# Patient Record
Sex: Male | Born: 1975
Health system: Southern US, Community
[De-identification: ages and names within clinical notes are randomized; demographics above are authoritative.]

## PROBLEM LIST (undated history)

## (undated) DIAGNOSIS — E785 Hyperlipidemia, unspecified: Secondary | ICD-10-CM

## (undated) DIAGNOSIS — F419 Anxiety disorder, unspecified: Secondary | ICD-10-CM

## (undated) HISTORY — DX: Hyperlipidemia, unspecified: E78.5

## (undated) HISTORY — DX: Anxiety disorder, unspecified: F41.9

---

## 2008-01-10 HISTORY — PX: KNEE SURGERY: SHX244

## 2009-07-12 ENCOUNTER — Emergency Department (HOSPITAL_COMMUNITY): Admission: EM | Admit: 2009-07-12 | Discharge: 2009-07-13 | Payer: Self-pay | Admitting: Emergency Medicine

## 2013-04-23 ENCOUNTER — Ambulatory Visit: Payer: 59

## 2013-04-23 ENCOUNTER — Ambulatory Visit (INDEPENDENT_AMBULATORY_CARE_PROVIDER_SITE_OTHER): Payer: 59 | Admitting: Family Medicine

## 2013-04-23 VITALS — BP 104/64 | HR 48 | Temp 97.8°F | Resp 12 | Ht 67.0 in | Wt 166.0 lb

## 2013-04-23 DIAGNOSIS — R0789 Other chest pain: Secondary | ICD-10-CM

## 2013-04-23 DIAGNOSIS — Z0000 Encounter for general adult medical examination without abnormal findings: Secondary | ICD-10-CM

## 2013-04-23 DIAGNOSIS — I498 Other specified cardiac arrhythmias: Secondary | ICD-10-CM

## 2013-04-23 DIAGNOSIS — R001 Bradycardia, unspecified: Secondary | ICD-10-CM

## 2013-04-23 LAB — LIPID PANEL
Cholesterol: 258 mg/dL — ABNORMAL HIGH (ref 0–200)
HDL: 63 mg/dL (ref >39)
LDL Cholesterol: 172 mg/dL — ABNORMAL HIGH (ref 0–99)
Total CHOL/HDL Ratio: 4.1 ratio
Triglycerides: 117 mg/dL (ref <150)
VLDL: 23 mg/dL (ref 0–40)

## 2013-04-23 LAB — COMPREHENSIVE METABOLIC PANEL WITH GFR
AST: 20 U/L (ref 0–37)
Alkaline Phosphatase: 47 U/L (ref 39–117)
Calcium: 9.8 mg/dL (ref 8.4–10.5)
Chloride: 103 meq/L (ref 96–112)
Potassium: 4.4 meq/L (ref 3.5–5.3)
Sodium: 140 meq/L (ref 135–145)
Total Bilirubin: 0.9 mg/dL (ref 0.2–1.2)

## 2013-04-23 LAB — POCT UA - MICROSCOPIC ONLY
Bacteria, U Microscopic: NEGATIVE
Casts, Ur, LPF, POC: NEGATIVE
Crystals, Ur, HPF, POC: NEGATIVE
Epithelial cells, urine per micros: NEGATIVE
Mucus, UA: NEGATIVE
RBC, urine, microscopic: NEGATIVE
WBC, Ur, HPF, POC: NEGATIVE
Yeast, UA: NEGATIVE

## 2013-04-23 LAB — POCT URINALYSIS DIPSTICK
Bilirubin, UA: NEGATIVE
Blood, UA: NEGATIVE
Glucose, UA: NEGATIVE
Ketones, UA: NEGATIVE
Leukocytes, UA: NEGATIVE
Nitrite, UA: NEGATIVE
Protein, UA: NEGATIVE
Spec Grav, UA: 1.015
Urobilinogen, UA: 0.2
pH, UA: 7.5

## 2013-04-23 LAB — POCT CBC
Granulocyte percent: 60.6 %G (ref 37–80)
HCT, POC: 45.9 % (ref 43.5–53.7)
Hemoglobin: 14.8 g/dL (ref 14.1–18.1)
Lymph, poc: 1.6 (ref 0.6–3.4)
MCH, POC: 28.9 pg (ref 27–31.2)
MCHC: 32.2 g/dL (ref 31.8–35.4)
MCV: 89.7 fL (ref 80–97)
MID (cbc): 0.4 (ref 0–0.9)
MPV: 10.1 fL (ref 0–99.8)
POC Granulocyte: 3.1 (ref 2–6.9)
POC LYMPH PERCENT: 32.3 % (ref 10–50)
POC MID %: 7.1 %M (ref 0–12)
Platelet Count, POC: 223 10*3/uL (ref 142–424)
RBC: 5.12 M/uL (ref 4.69–6.13)
RDW, POC: 14 %
WBC: 5.1 10*3/uL (ref 4.6–10.2)

## 2013-04-23 LAB — COMPREHENSIVE METABOLIC PANEL
ALT: 25 U/L (ref 0–53)
Albumin: 4.8 g/dL (ref 3.5–5.2)
BUN: 16 mg/dL (ref 6–23)
CO2: 27 mEq/L (ref 19–32)
Creat: 0.9 mg/dL (ref 0.50–1.35)
Glucose, Bld: 92 mg/dL (ref 70–99)
Total Protein: 7.5 g/dL (ref 6.0–8.3)

## 2013-04-23 LAB — TSH: TSH: 2.431 u[IU]/mL (ref 0.350–4.500)

## 2013-04-23 NOTE — Patient Instructions (Signed)
Diet for Gastroesophageal Reflux Disease, Adult °Reflux (acid reflux) is when acid from your stomach flows up into the esophagus. When acid comes in contact with the esophagus, the acid causes irritation and soreness (inflammation) in the esophagus. When reflux happens often or so severely that it causes damage to the esophagus, it is called gastroesophageal reflux disease (GERD). Nutrition therapy can help ease the discomfort of GERD. °FOODS OR DRINKS TO AVOID OR LIMIT °· Smoking or chewing tobacco. Nicotine is one of the most potent stimulants to acid production in the gastrointestinal tract. °· Caffeinated and decaffeinated coffee and black tea. °· Regular or low-calorie carbonated beverages or energy drinks (caffeine-free carbonated beverages are allowed).   °· Strong spices, such as black pepper, white pepper, red pepper, cayenne, curry powder, and chili powder. °· Peppermint or spearmint. °· Chocolate. °· High-fat foods, including meats and fried foods. Extra added fats including oils, butter, salad dressings, and nuts. Limit these to less than 8 tsp per day. °· Fruits and vegetables if they are not tolerated, such as citrus fruits or tomatoes. °· Alcohol. °· Any food that seems to aggravate your condition. °If you have questions regarding your diet, call your caregiver or a registered dietitian. °OTHER THINGS THAT MAY HELP GERD INCLUDE:  °· Eating your meals slowly, in a relaxed setting. °· Eating 5 to 6 small meals per day instead of 3 large meals. °· Eliminating food for a period of time if it causes distress. °· Not lying down until 3 hours after eating a meal. °· Keeping the head of your bed raised 6 to 9 inches (15 to 23 cm) by using a foam wedge or blocks under the legs of the bed. Lying flat may make symptoms worse. °· Being physically active. Weight loss may be helpful in reducing reflux in overweight or obese adults. °· Wear loose fitting clothing °EXAMPLE MEAL PLAN °This meal plan is approximately  2,000 calories based on [employer]My[employer]gov meal planning guidelines. °Breakfast °· ½ cup cooked oatmeal. °· 1 cup strawberries. °· 1 cup low-fat milk. °· 1 oz almonds. °Snack °· 1 cup cucumber slices. °· 6 oz yogurt (made from low-fat or fat-free milk). °Lunch °· 2 slice whole-wheat bread. °· 2½ oz sliced turkey. °· 2 tsp mayonnaise. °· 1 cup blueberries. °· 1 cup snap peas. °Snack °· 6 whole-wheat crackers. °· 1 oz string cheese. °Dinner °· ½ cup brown rice. °· 1 cup mixed veggies. °· 1 tsp olive oil. °· 3 oz grilled fish. °Document Released: 12/26/2004 Document Revised: 03/20/2011 Document Reviewed: 11/11/2010 °ExitCare® Patient Information ©2014 [employer]. ° °

## 2013-04-23 NOTE — Progress Notes (Addendum)
Chief Complaint:  Chief Complaint  Patient presents with  . Annual Exam    HPI:was at exert , he exercises and runs and  Troy Ashley is a 38 y.o. male who is here for CPE Has not had a PE in a few years, 3-4 years. He has been having chest tightness, for the last 1 month, dull and deep pain diffuse, underneath sternum, has not been bad in the last 2 weeks but it was worse at nbight getting ready at night, a couple of occasion and made it worse, and he does n ot have CP with runnng or cardio. LAsts for a few hours. He denies any acid reflux, he had some in the past. He takes a teaspoon of yellow mustard.  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  No HTN, + hyperlipidemia, + very rare tobacco use when he plays golf No family heart history, + anxiety Has never had CP like this before.  Denies any n/v/abd pain, diaphoresis, leg swelling with CP; denies CHF    Past Medical History  Diagnosis Date  . Hyperlipidemia    History reviewed. No pertinent past surgical history. History   Social History  . Marital Status: Single    Spouse Name: N/A    Number of Children: N/A  . Years of Education: N/A   Social History Main Topics  . Smoking status: Light Tobacco Smoker    Types: Cigars  . Smokeless tobacco: None     Comment: occasional cigar  . Alcohol Use: None  . Drug Use: None  . Sexual Activity: Yes   Other Topics Concern  . None   Social History Narrative  . None   Family History  Problem Relation Age of Onset  . Anxiety disorder Mother    Allergies  Allergen Reactions  . Codeine Other (See Comments)    itchy   Prior to Admission medications   Not on File     ROS: The patient denies fevers, chills, night sweats, unintentional weight loss, chest pain, palpitations, wheezing, dyspnea on exertion, nausea, vomiting, abdominal pain, dysuria, hematuria, melena, numbness,  weakness, or tingling.   All other systems have been reviewed and were otherwise negative with the exception of those mentioned in the HPI and as above.    PHYSICAL EXAM: Filed Vitals:   04/23/13 0842  BP: 104/64  Pulse: 48  Temp: 97.8 F (36.6 C)  Resp: 12   Filed Vitals:   04/23/13 0842  Height: 5\' 7"  (1.702 m)  Weight: 166 lb (75.297 kg)   Body mass index is 25.99 kg/(m^2).  General: Alert, no acute distress HEENT:  Normocephalic, atraumatic, oropharynx patent. EOMI, PERRLA, fundo exam is normal, TM nl Cardiovascular:  Sinus brady, no rubs murmurs or gallops.  No Carotid bruits, radial pulse intact. No pedal edema.  Respiratory: Clear to auscultation bilaterally.  No wheezes, rales, or rhonchi.  No cyanosis, no use of accessory musculature GI: No organomegaly, abdomen is soft and non-tender, positive bowel sounds.  No masses. Skin: No rashes. Neurologic: Facial musculature symmetric. Psychiatric: Patient is appropriate throughout our interaction. Lymphatic: No cervical lymphadenopathy Musculoskeletal: Gait intact. 5/5 UE and Olan Kurek, 2/2 DTRs Circumcised, no rashes, neg inguinal hernia, no masses Normal GU exam   LABS: Results for orders placed in visit on 04/23/13  POCT CBC      Result Value Ref Range   WBC 5.1  4.6 - 10.2 K/uL   Lymph, poc 1.6  0.6 - 3.4   POC LYMPH PERCENT 32.3  10 - 50 %L   MID (cbc) 0.4  0 - 0.9   POC MID % 7.1  0 - 12 %M   POC Granulocyte 3.1  2 - 6.9   Granulocyte percent 60.6  37 - 80 %G   RBC 5.12  4.69 - 6.13 M/uL   Hemoglobin 14.8  14.1 - 18.1 g/dL   HCT, POC 45.9  43.5 - 53.7 %   MCV 89.7  80 - 97 fL   MCH, POC 28.9  27 - 31.2 pg   MCHC 32.2  31.8 - 35.4 g/dL   RDW, POC 14.0     Platelet Count, POC 223  142 - 424 K/uL   MPV 10.1  0 - 99.8 fL  POCT UA - MICROSCOPIC ONLY      Result Value Ref Range   WBC, Ur, HPF, POC neg     RBC, urine, microscopic neg     Bacteria, U Microscopic neg     Mucus, UA neg     Epithelial cells, urine  per micros neg     Crystals, Ur, HPF, POC neg     Casts, Ur, LPF, POC neg     Yeast, UA neg    POCT URINALYSIS DIPSTICK      Result Value Ref Range   Color, UA yellow     Clarity, UA clear     Glucose, UA neg     Bilirubin, UA neg     Ketones, UA neg     Spec Grav, UA 1.015     Blood, UA neg     pH, UA 7.5     Protein, UA neg     Urobilinogen, UA 0.2     Nitrite, UA neg     Leukocytes, UA Negative       EKG/XRAY:   Primary read interpreted by Dr. Marin Comment at [facility]. CXR no acute cardiopulmonary process,  inc vasc markings  ekg shows sinus brady, no ST changes    ASSESSMENT/PLAN: Encounter Diagnoses  Name Primary?  . Routine general medical examination at a health care facility Yes  . Chest pressure   . 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 Patient is aware that all medications have potential sideeffects and we are unable to predict every sideeffect or drug-drug interaction that may occur.  Glenford Bayley, DO 04/23/2013 10:13 AM

## 2013-05-01 ENCOUNTER — Encounter: Payer: Self-pay | Admitting: Family Medicine

## 2013-07-17 ENCOUNTER — Ambulatory Visit (INDEPENDENT_AMBULATORY_CARE_PROVIDER_SITE_OTHER): Payer: 59 | Admitting: Family Medicine

## 2013-07-17 VITALS — BP 120/72 | HR 83 | Temp 101.4°F | Resp 18 | Ht 68.0 in | Wt 165.0 lb

## 2013-07-17 DIAGNOSIS — R509 Fever, unspecified: Secondary | ICD-10-CM

## 2013-07-17 DIAGNOSIS — G4700 Insomnia, unspecified: Secondary | ICD-10-CM

## 2013-07-17 DIAGNOSIS — T148 Other injury of unspecified body region: Secondary | ICD-10-CM

## 2013-07-17 DIAGNOSIS — R21 Rash and other nonspecific skin eruption: Secondary | ICD-10-CM

## 2013-07-17 DIAGNOSIS — W57XXXA Bitten or stung by nonvenomous insect and other nonvenomous arthropods, initial encounter: Secondary | ICD-10-CM

## 2013-07-17 MED ORDER — IBUPROFEN 200 MG PO TABS
600.0000 mg | ORAL_TABLET | Freq: Once | ORAL | Status: AC
  Administered 2013-07-17: 600 mg via ORAL

## 2013-07-17 MED ORDER — DOXYCYCLINE HYCLATE 100 MG PO TABS: 100.0000 mg | ORAL_TABLET | Freq: Two times a day (BID) | ORAL | Status: DC

## 2013-07-17 NOTE — Progress Notes (Signed)
Chief Complaint:  Chief Complaint  Patient presents with  . Fever    Tuesday night  . Generalized Body Aches    X Tuesday night  . Headache    X Tuesday night  . Insect Bite    Noticed yesterday    HPI: Troy Ashley is a 38 y.o. male who is here for a 2 day history of fevers, chills, poor sleep and weak and noticed insect bite on back yesterday. T max was 100.7, taking ibuprofen 3 pill . Has not been feeling restless, body aches. Woke up at 4 am and felt miserable. The area in the back is tender. He noticed the rash  In the back  in the mirror. He does not know if it was a tick, he was out [place] the woods.   Past Medical History  Diagnosis Date  . Hyperlipidemia    History reviewed. No pertinent past surgical history. History   Social History  . Marital Status: Single    Spouse Name: N/A    Number of Children: N/A  . Years of Education: N/A   Social History Main Topics  . Smoking status: Light Tobacco Smoker    Types: Cigars  . Smokeless tobacco: None     Comment: occasional cigar  . Alcohol Use: None  . Drug Use: None  . Sexual Activity: Yes   Other Topics Concern  . None   Social History Narrative  . None   Family History  Problem Relation Age of Onset  . Anxiety disorder Mother    Allergies  Allergen Reactions  . Codeine Other (See Comments)    itchy   Prior to Admission medications   Not on File     ROS: The patient denies  night sweats, unintentional weight loss, chest pain, palpitations, wheezing, dyspnea on exertion, nausea, vomiting, abdominal pain, dysuria, hematuria, melena, numbness,  or tingling.   All other systems have been reviewed and were otherwise negative with the exception of those mentioned in the HPI and as above.    PHYSICAL EXAM: Filed Vitals:   07/17/13 1506  BP: 120/72  Pulse: 83  Temp: 101.4 F (38.6 C)  Resp: 18   Filed Vitals:   07/17/13 1506  Height: 5\' 8"  (1.727 m)  Weight: 165 lb (74.844 kg)    Body mass index is 25.09 kg/(m^2).  General: Alert, no acute distress HEENT:  Normocephalic, atraumatic, oropharynx patent. EOMI, PERRLA Cardiovascular:  Regular rate and rhythm, no rubs murmurs or gallops.  No Carotid bruits, radial pulse intact. No pedal edema.  Respiratory: Clear to auscultation bilaterally.  No wheezes, rales, or rhonchi.  No cyanosis, no use of accessory musculature GI: No organomegaly, abdomen is soft and non-tender, positive bowel sounds.  No masses. Skin: + right sided circular rash, tender. No fluctuance, no warmth Neurologic: Facial musculature symmetric. Psychiatric: Patient is appropriate throughout our interaction. Lymphatic: No cervical lymphadenopathy Musculoskeletal: Gait intact.   LABS: Results for orders placed in visit on 04/23/13  COMPREHENSIVE METABOLIC PANEL      Result Value Ref Range   Sodium 140  135 - 145 mEq/L   Potassium 4.4  3.5 - 5.3 mEq/L   Chloride 103  96 - 112 mEq/L   CO2 27  19 - 32 mEq/L   Glucose, Bld 92  70 - 99 mg/dL   BUN 16  6 - 23 mg/dL   Creat 0.90  0.50 - 1.35 mg/dL   Total Bilirubin 0.9  0.2 -  1.2 mg/dL   Alkaline Phosphatase 47  39 - 117 U/L   AST 20  0 - 37 U/L   ALT 25  0 - 53 U/L   Total Protein 7.5  6.0 - 8.3 g/dL   Albumin 4.8  3.5 - 5.2 g/dL   Calcium 9.8  8.4 - 10.5 mg/dL  LIPID PANEL      Result Value Ref Range   Cholesterol 258 (*) 0 - 200 mg/dL   Triglycerides 117  <150 mg/dL   HDL 63  >39 mg/dL   Total CHOL/HDL Ratio 4.1     VLDL 23  0 - 40 mg/dL   LDL Cholesterol 172 (*) 0 - 99 mg/dL  TSH      Result Value Ref Range   TSH 2.431  0.350 - 4.500 uIU/mL  POCT CBC      Result Value Ref Range   WBC 5.1  4.6 - 10.2 K/uL   Lymph, poc 1.6  0.6 - 3.4   POC LYMPH PERCENT 32.3  10 - 50 %L   MID (cbc) 0.4  0 - 0.9   POC MID % 7.1  0 - 12 %M   POC Granulocyte 3.1  2 - 6.9   Granulocyte percent 60.6  37 - 80 %G   RBC 5.12  4.69 - 6.13 M/uL   Hemoglobin 14.8  14.1 - 18.1 g/dL   HCT, POC 45.9  43.5 -  53.7 %   MCV 89.7  80 - 97 fL   MCH, POC 28.9  27 - 31.2 pg   MCHC 32.2  31.8 - 35.4 g/dL   RDW, POC 14.0     Platelet Count, POC 223  142 - 424 K/uL   MPV 10.1  0 - 99.8 fL  POCT UA - MICROSCOPIC ONLY      Result Value Ref Range   WBC, Ur, HPF, POC neg     RBC, urine, microscopic neg     Bacteria, U Microscopic neg     Mucus, UA neg     Epithelial cells, urine per micros neg     Crystals, Ur, HPF, POC neg     Casts, Ur, LPF, POC neg     Yeast, UA neg    POCT URINALYSIS DIPSTICK      Result Value Ref Range   Color, UA yellow     Clarity, UA clear     Glucose, UA neg     Bilirubin, UA neg     Ketones, UA neg     Spec Grav, UA 1.015     Blood, UA neg     pH, UA 7.5     Protein, UA neg     Urobilinogen, UA 0.2     Nitrite, UA neg     Leukocytes, UA Negative       EKG/XRAY:   Primary read interpreted by Dr. Marin Comment at [facility]   ASSESSMENT/PLAN: Encounter Diagnoses  Name Primary?  . Fever, unspecified Yes  . Rash and nonspecific skin eruption   . Insomnia   . Insect bite    Labs pending CBC, CMP Rx Doxycyline 100 mg BID x 10 days No need for  LYME or RMSF since too early F/u prn  Gross sideeffects, risk and benefits, and alternatives of medications d/w patient. Patient is aware that all medications have potential sideeffects and we are unable to predict every sideeffect or drug-drug interaction that may occur.  LE, [place], DO 07/17/2013 4:24 PM

## 2013-07-18 ENCOUNTER — Telehealth: Payer: Self-pay | Admitting: Family Medicine

## 2013-07-18 LAB — COMPLETE METABOLIC PANEL WITH GFR
Albumin: 4.5 g/dL (ref 3.5–5.2)
Alkaline Phosphatase: 48 U/L (ref 39–117)
Calcium: 9.4 mg/dL (ref 8.4–10.5)
Chloride: 102 mEq/L (ref 96–112)
Creat: 0.9 mg/dL (ref 0.50–1.35)
GFR, Est African American: >89 mL/min
GFR, Est Non African American: >89 mL/min
Potassium: 4 mEq/L (ref 3.5–5.3)
Sodium: 134 mEq/L — ABNORMAL LOW (ref 135–145)

## 2013-07-18 LAB — COMPLETE METABOLIC PANEL WITHOUT GFR
ALT: 23 U/L (ref 0–53)
AST: 21 U/L (ref 0–37)
BUN: 8 mg/dL (ref 6–23)
CO2: 23 meq/L (ref 19–32)
Glucose, Bld: 105 mg/dL — ABNORMAL HIGH (ref 70–99)
Total Bilirubin: 0.6 mg/dL (ref 0.2–1.2)
Total Protein: 7.1 g/dL (ref 6.0–8.3)

## 2013-07-18 LAB — CBC
HCT: 41 % (ref 39.0–52.0)
Hemoglobin: 14.4 g/dL (ref 13.0–17.0)
MCH: 29.1 pg (ref 26.0–34.0)
MCHC: 35.1 g/dL (ref 30.0–36.0)
MCV: 82.8 fL (ref 78.0–100.0)
Platelets: 187 10*3/uL (ref 150–400)
RBC: 4.95 MIL/uL (ref 4.22–5.81)
RDW: 13.8 % (ref 11.5–15.5)
WBC: 4.7 10*3/uL (ref 4.0–10.5)

## 2013-07-18 NOTE — Telephone Encounter (Signed)
Spoke to patient about labs.

## 2013-08-11 ENCOUNTER — Telehealth: Payer: Self-pay

## 2013-08-11 NOTE — Telephone Encounter (Signed)
Pt called in because he saw Dr. Marin Comment a couple of weeks ago, and was prescribed doxycycline (VIBRA-TABS) 100 MG tablet for a tick bite, he said that he is concerned about some of his continued symptoms, would like for Dr.Le or nurse to please give him a call.

## 2013-08-14 NOTE — Telephone Encounter (Signed)
lmom for pt to cb

## 2013-08-15 ENCOUNTER — Encounter: Payer: Self-pay | Admitting: Family Medicine

## 2013-08-15 NOTE — Telephone Encounter (Signed)
lmom for pt to cb

## 2013-08-15 NOTE — Telephone Encounter (Signed)
Pt needs to RTC for continued symptoms. LM to advise this on private VM.

## 2014-05-01 ENCOUNTER — Encounter: Payer: Self-pay | Admitting: Family Medicine

## 2015-04-21 ENCOUNTER — Ambulatory Visit (INDEPENDENT_AMBULATORY_CARE_PROVIDER_SITE_OTHER): Payer: Commercial Managed Care - HMO | Admitting: Family Medicine

## 2015-04-21 ENCOUNTER — Encounter: Payer: Self-pay | Admitting: Family Medicine

## 2015-04-21 VITALS — BP 104/80 | HR 64 | Temp 98.4°F | Ht 67.72 in | Wt 159.9 lb

## 2015-04-21 DIAGNOSIS — M79601 Pain in right arm: Secondary | ICD-10-CM | POA: Diagnosis not present

## 2015-04-21 DIAGNOSIS — E785 Hyperlipidemia, unspecified: Secondary | ICD-10-CM | POA: Insufficient documentation

## 2015-04-21 NOTE — Progress Notes (Signed)
HPI:  Troy Ashley is here to establish care with me. Last PCP and physical about 2 years ago.  He has history of HLD, on non pharmacologic treatment. He also states that he worries all the time for things he should not be worried about. He attributes it to mild anxiety. He can deal with stress and this does not cause problems at work or home. He denies depression or suicidal thoughts. Mother Hx of anxiety.  Has the following chronic problems that require follow up and concerns today:   Right shoulder "discomfort" for about 2 years after right shoulder injury with radicular pain RUE, resolved after chiropractor treatment. Since then he does not feel like RUE was completely back to normal, even though  Strength and ROM normalized.  He has had intermittent mild sore like pain on anterior aspect of shoulder and axillary area. No cervical pain, no numbness, no tingling, or burning. No weakness and no limitation of ROM or on his normal daily activities. He plays golf and notes symptoms a day after or so. He does not take medication for pain.   REVIEW OF SYSTEMS:  General: Negative for fever, fatigue, abnormal weight loss, changes in appetite.  Eyes: Negative for conjunctival erythema, visual changes.  ENT: Negative for earache, hearing loss, or ear drainage. Negative for rhinorrhea, nasal congestion, sinus pain, epistaxis. Negative for oral lesions, odynophagia, dysphagia.  Neck: negative for swollen glands or masses.  Cardiac: Negative for chest pain, exertional dyspnea, irregular HR, claudication, cold extremities, or edema.  Respiratory: Negative for cough, dyspnea, wheezing, or hemoptysis.  Abdomen: Negative for abdominal pain, changes in bowel habits, blood in stool or melena, nausea, or vomiting.  GU: Negative for dysuria, urinary frequency, urinary ungency, gross hematuria, urine incontinence.  MS: No joint erythema or edema, joint stiffness, or limitation of  ROM. + Right shoulder discomfort.  Neurologic: No confusion,  focal weakness, numbness or tingling, frequent/severe headaches, or tremor.  Psychiatric: No depression or sleep disorder. + Mild anxiety.  Skin: Negative for rash, ulcers, or skin color changes.   Past Medical History  Diagnosis Date  . Hyperlipidemia     Past Surgical History  Procedure Laterality Date  . Knee surgery Right 2010    Family History  Problem Relation Age of Onset  . Anxiety disorder Mother   . Hypertension Mother     Social History   Social History  . Marital Status: Single    Spouse Name: N/A  . Number of Children: N/A  . Years of Education: N/A   Social History Main Topics  . Smoking status: Light Tobacco Smoker    Types: Cigars  . Smokeless tobacco: None     Comment: occasional cigar  . Alcohol Use: None  . Drug Use: None  . Sexual Activity: Yes   Other Topics Concern  . None   Social History Narrative    No current outpatient prescriptions on file.   EXAM:  Filed Vitals:   04/21/15 1011  BP: 104/80  Pulse: 64  Temp: 98.4 F (36.9 C)    Body mass index is 24.52 kg/(m^2).  GENERAL: vitals reviewed and listed above. Well developed and nourished, appears well hydrated and in no acute distress.  ENT: atraumatic, conjunttiva clear, no obvious abnormalities on inspection of external nose and ears.  NECK: no obvious masses on inspection, normal ROM.  LUNGS: clear to auscultation bilaterally, no wheezes, rales or rhonchi, good air movement  CV: Regular rate and rhythm, no murmurs  appreciated, no peripheral edema. DP pulses 2+ bilateral.  ABDOMEN: Soft, no tender, no masses or hepatosplenomegaly appreciated.  MS: moves all extremities without noticeable abnormality, no muscle atrophy. Right shoulder: No tenderness with palpation or ROM, no masses. Cervical spine with normal ROM, no tenderness with palpation of paraspinal muscles and no pain elicited with  movement.  LYMPH: No axillary lymphadenopathy right. No cervical or supraclavicular lymphadenopathy.  NEURO: Alert and oriented x 3, no focal deficit appreciated, normal gait.  PSYCH: pleasant and cooperative, no obvious depression, well groomed, good eye contact. Mildly anxious.   ASSESSMENT AND PLAN:  Discussed the following assessment and plan:  Musculoskeletal pain of right upper extremity I think right shoulder discomfort is muscle related and could be residual symptom from prior injury. It seems to be stable for 2 years, so I do not think further work up is needed at this point. I recommended continuing Yoga and stretching exercises. F/U as needed.  Hyperlipidemia  Continue low fat diet. Further recommendations will be given according to results, future lab order placed sine he is not fasting today.   - Plan: Lipid Panel    -We reviewed the PMH, PSH, FH, SH, Meds and Allergies. -We provided refills for any medications we will prescribe as needed. -We addressed current concerns per orders and patient instructions. -We have asked for records for pertinent exams, studies, vaccines and notes from previous providers. He thinks his last Tdap was in 2013 after hand laceration.    -Patient advised to return or notify a doctor immediately if symptoms worsen or persist or new concerns arise.      Adysen Raphael G. Martinique, MD  [facility]. [place] office.

## 2015-04-21 NOTE — Progress Notes (Signed)
Pre visit review using our clinic review tool, if applicable. No additional management support is needed unless otherwise documented below in the visit note. 

## 2015-04-21 NOTE — Patient Instructions (Signed)
Thing to remember for today's visit:  Examination today normal. Right shoulder discomfort doe snot seem worrisome, be sure to warm and stretch before playing golf and continue Yoga. Continue healthy diet and regular exercise. I think you can come back in a year for his routine physical, we will schedule a cholesterol lab.

## 2015-04-30 ENCOUNTER — Other Ambulatory Visit (INDEPENDENT_AMBULATORY_CARE_PROVIDER_SITE_OTHER): Payer: Commercial Managed Care - HMO

## 2015-04-30 ENCOUNTER — Telehealth: Payer: Self-pay

## 2015-04-30 DIAGNOSIS — E785 Hyperlipidemia, unspecified: Secondary | ICD-10-CM

## 2015-04-30 LAB — LIPID PANEL
CHOL/HDL RATIO: 4
CHOLESTEROL: 248 mg/dL — AB (ref 0–200)
HDL: 60.6 mg/dL (ref >39.00)
LDL Cholesterol: 172 mg/dL — ABNORMAL HIGH (ref 0–99)
NonHDL: 186.93
TRIGLYCERIDES: 73 mg/dL (ref 0.0–149.0)
VLDL: 14.6 mg/dL (ref 0.0–40.0)

## 2015-04-30 NOTE — Telephone Encounter (Signed)
Pt would like to get blood test done in 3 month. Is that ok with you?

## 2015-04-30 NOTE — Telephone Encounter (Signed)
-----   Message from Betty G Martinique, MD sent at 04/30/2015  3:06 PM EDT ----- Cholesterol otherwise stable, mildly elevated. For now I do not think medication is needed. Low fat diet (animal fat mainly) and regular exercise recommended for now. It can be re-checked annually. Thanks.

## 2015-05-03 NOTE — Telephone Encounter (Signed)
If he wants he can repeat FLP in 4-6 months, although I still think he could wait a year. Thanks. BJ

## 2015-09-23 ENCOUNTER — Other Ambulatory Visit (INDEPENDENT_AMBULATORY_CARE_PROVIDER_SITE_OTHER): Payer: Commercial Managed Care - HMO

## 2015-09-23 DIAGNOSIS — E785 Hyperlipidemia, unspecified: Secondary | ICD-10-CM

## 2015-09-23 DIAGNOSIS — Z0000 Encounter for general adult medical examination without abnormal findings: Secondary | ICD-10-CM | POA: Diagnosis not present

## 2015-09-23 LAB — LIPID PANEL
CHOLESTEROL: 199 mg/dL (ref 0–200)
HDL: 52.3 mg/dL (ref >39.00)
LDL CALC: 119 mg/dL — AB (ref 0–99)
NonHDL: 146.61
TRIGLYCERIDES: 139 mg/dL (ref 0.0–149.0)
Total CHOL/HDL Ratio: 4
VLDL: 27.8 mg/dL (ref 0.0–40.0)

## 2015-09-23 LAB — BASIC METABOLIC PANEL
BUN: 15 mg/dL (ref 6–23)
CHLORIDE: 107 meq/L (ref 96–112)
CO2: 26 mEq/L (ref 19–32)
Calcium: 9.3 mg/dL (ref 8.4–10.5)
Creatinine, Ser: 0.92 mg/dL (ref 0.40–1.50)
GFR: 97.87 mL/min (ref >60.00)
GLUCOSE: 60 mg/dL — AB (ref 70–99)
POTASSIUM: 3.6 meq/L (ref 3.5–5.1)
SODIUM: 144 meq/L (ref 135–145)

## 2015-09-23 LAB — POC URINALSYSI DIPSTICK (AUTOMATED)
BILIRUBIN UA: NEGATIVE
GLUCOSE UA: NEGATIVE
Ketones, UA: NEGATIVE
Leukocytes, UA: NEGATIVE
Nitrite, UA: NEGATIVE
Protein, UA: NEGATIVE
RBC UA: NEGATIVE
SPEC GRAV UA: 1.02
Urobilinogen, UA: 0.2
pH, UA: 5.5

## 2015-09-23 LAB — HEPATIC FUNCTION PANEL
ALBUMIN: 4.4 g/dL (ref 3.5–5.2)
ALK PHOS: 44 U/L (ref 39–117)
ALT: 16 U/L (ref 0–53)
AST: 13 U/L (ref 0–37)
BILIRUBIN DIRECT: 0.1 mg/dL (ref 0.0–0.3)
TOTAL PROTEIN: 7 g/dL (ref 6.0–8.3)
Total Bilirubin: 0.4 mg/dL (ref 0.2–1.2)

## 2015-09-23 LAB — CBC WITH DIFFERENTIAL/PLATELET
BASOS ABS: 0 10*3/uL (ref 0.0–0.1)
Basophils Relative: 0.3 % (ref 0.0–3.0)
EOS ABS: 0.1 10*3/uL (ref 0.0–0.7)
Eosinophils Relative: 2.6 % (ref 0.0–5.0)
HEMATOCRIT: 41.5 % (ref 39.0–52.0)
HEMOGLOBIN: 14.1 g/dL (ref 13.0–17.0)
LYMPHS PCT: 29.2 % (ref 12.0–46.0)
Lymphs Abs: 1.4 10*3/uL (ref 0.7–4.0)
MCHC: 33.9 g/dL (ref 30.0–36.0)
MCV: 86.3 fl (ref 78.0–100.0)
MONOS PCT: 10.9 % (ref 3.0–12.0)
Monocytes Absolute: 0.5 10*3/uL (ref 0.1–1.0)
NEUTROS ABS: 2.7 10*3/uL (ref 1.4–7.7)
Neutrophils Relative %: 57 % (ref 43.0–77.0)
Platelets: 199 10*3/uL (ref 150.0–400.0)
RBC: 4.81 Mil/uL (ref 4.22–5.81)
RDW: 13.4 % (ref 11.5–15.5)
WBC: 4.7 10*3/uL (ref 4.0–10.5)

## 2015-09-23 LAB — TSH: TSH: 1.55 u[IU]/mL (ref 0.35–4.50)

## 2015-09-27 ENCOUNTER — Encounter: Payer: Commercial Managed Care - HMO | Admitting: Family Medicine

## 2015-09-28 ENCOUNTER — Encounter: Payer: Self-pay | Admitting: Family Medicine

## 2015-09-28 ENCOUNTER — Ambulatory Visit (INDEPENDENT_AMBULATORY_CARE_PROVIDER_SITE_OTHER): Payer: Commercial Managed Care - HMO | Admitting: Family Medicine

## 2015-09-28 VITALS — BP 124/80 | HR 79 | Temp 98.6°F | Resp 12 | Ht 67.72 in | Wt 160.4 lb

## 2015-09-28 DIAGNOSIS — Z0000 Encounter for general adult medical examination without abnormal findings: Secondary | ICD-10-CM | POA: Diagnosis not present

## 2015-09-28 DIAGNOSIS — E785 Hyperlipidemia, unspecified: Secondary | ICD-10-CM

## 2015-09-28 DIAGNOSIS — N50812 Left testicular pain: Secondary | ICD-10-CM | POA: Diagnosis not present

## 2015-09-28 NOTE — Progress Notes (Signed)
Pre visit review using our clinic review tool, if applicable. No additional management support is needed unless otherwise documented below in the visit note. 

## 2015-09-28 NOTE — Patient Instructions (Addendum)
A few things to remember from today's visit:   Routine general medical examination at a health care facility  Left testicular pain  It seems like a small epididymal cyst, monitor for now. Oral Ibuprofen 3 times daily for 5-7 days and local heat. Loose underwear and no bike.  At least 150 minutes of moderate exercise per week, daily brisk walking for 15-30 min is a good exercise option. Healthy diet low in saturated (animal) fats and sweets and consisting of fresh fruits and vegetables, lean meats such as fish and white chicken and whole grains.  - Vaccines:  Tdap vaccine every 10 years.  Shingles vaccine recommended at age 24, could be given after 38 years of age but not sure about insurance coverage.  Pneumonia vaccines:  Prevnar 67 at 22 and Pneumovax at 53.   -Screening recommendations for low/normal risk males:  Screening for diabetes at age 14-45 and every 3 years.    Lipid screening at 35 and every 3 years.   Colonoscopy for colon cancer screening at age 29 and until age 64.  Prostate cancer screening: some controversy.      Please be sure medication list is accurate. If a new problem present, please set up appointment sooner than planned today.

## 2015-09-28 NOTE — Progress Notes (Signed)
HPI:  [name] is a 40 y.o.male here today for his routine physical examination.  He lives with wife and daughter.  He follows a healthy diet and exercises regularly: Bike and yoga.  Chronic medical problems: HLD, on non pharmacologic treatment.  Hx of STD's: Denies   FHx negative for colon or prostate cancer.  -Denies high alcohol intake or Hx of illicit drug use. Smokes cigars occasionally.  -Concerns and/or follow up today: HLD and left testicular pain that started yesterday.  He had labs done recently.   Lab Results  Component Value Date   CHOL 199 09/21/2015   HDL 52.30 09/21/2015   LDLCALC 119 (H) 09/21/2015   TRIG 139.0 09/21/2015   CHOLHDL 4 09/21/2015   Lab Results  Component Value Date   ALT 16 09/23/2015   AST 13 09/23/2015   ALKPHOS 44 09/23/2015   BILITOT 0.4 09/23/2015   Lab Results  Component Value Date   TSH 1.55 09/23/2015   Lab Results  Component Value Date   WBC 4.7 09/23/2015   HGB 14.1 09/23/2015   HCT 41.5 09/23/2015   MCV 86.3 09/23/2015   PLT 199.0 09/23/2015    Lab Results  Component Value Date   CREATININE 0.92 09/23/2015   BUN 15 09/23/2015   NA 144 09/23/2015   K 3.6 09/23/2015   CL 107 09/23/2015   CO2 26 09/23/2015     Today he is complaining of left testicular pain, which she noted last night.  He denies any trauma, he has not noted any swelling or skin erythema. No urethral discharge or dysuria.  He describes pain as soreness, constant, moderate, no skin changes. Pain is exacerbated with palpation and alleviated by avoiding contact.    Review of Systems  Constitutional: Negative for activity change, appetite change, fatigue, fever and unexpected weight change.  HENT: Negative for dental problem, nosebleeds, sore throat, trouble swallowing and voice change.   Eyes: Negative for redness and visual disturbance.  Respiratory: Negative for apnea, cough, shortness of breath and wheezing.     Cardiovascular: Negative for chest pain, palpitations and leg swelling.  Gastrointestinal: Negative for abdominal pain, blood in stool, nausea and vomiting.       No changes in bowel habits.  Endocrine: Negative for cold intolerance, heat intolerance, polydipsia, polyphagia and polyuria.  Genitourinary: Positive for testicular pain (left). Negative for decreased urine volume, difficulty urinating, discharge, dysuria, genital sores and hematuria.  Musculoskeletal: Negative for arthralgias, back pain, joint swelling and myalgias.  Skin: Negative for color change and rash.  Neurological: Negative for dizziness, seizures, syncope, weakness, numbness and headaches.  Hematological: Negative for adenopathy. Does not bruise/bleed easily.  Psychiatric/Behavioral: Negative for confusion and sleep disturbance. The patient is not nervous/anxious.      No current outpatient prescriptions on file prior to visit.   No current facility-administered medications on file prior to visit.      Past Medical History:  Diagnosis Date  . Anxiety   . Hyperlipidemia     Allergies  Allergen Reactions  . Codeine Other (See Comments)    itchy    Family History  Problem Relation Age of Onset  . Anxiety disorder Mother   . Hypertension Mother     Social History   Social History  . Marital status: Married    Spouse name: N/A  . Number of children: N/A  . Years of education: N/A   Social History Main Topics  . Smoking status: Light  Tobacco Smoker    Types: Cigars  . Smokeless tobacco: Never Used     Comment: occasional cigar  . Alcohol use No  . Drug use: No  . Sexual activity: Yes   Other Topics Concern  . None   Social History Narrative  . None     Vitals:   09/28/15 1402  BP: 124/80  Pulse: 79  Resp: 12  Temp: 98.6 F (37 C)   Body mass index is 24.59 kg/m.  O2 sat at RA 98%  Wt Readings from Last 3 Encounters:  09/28/15 160 lb 6 oz (72.7 kg)  04/21/15 159 lb 14.4 oz  (72.5 kg)  07/17/13 165 lb (74.8 kg)      Physical Exam  Nursing note and vitals reviewed. Constitutional: He is oriented to person, place, and time. He appears well-developed and well-nourished. No distress.  HENT:  Head: Atraumatic.  Mouth/Throat: Oropharynx is clear and moist and mucous membranes are normal.  Eyes: Conjunctivae and EOM are normal. Pupils are equal, round, and reactive to light.  Neck: Normal range of motion. No thyroid mass and no thyromegaly present.  Cardiovascular: Normal rate and regular rhythm.   No murmur heard. Pulses:      Dorsalis pedis pulses are 2+ on the right side, and 2+ on the left side.  Respiratory: Effort normal and breath sounds normal. No respiratory distress.  GI: Soft. He exhibits no mass. There is no tenderness. Hernia confirmed negative in the right inguinal area and confirmed negative in the left inguinal area.  Genitourinary: Penis normal. Right testis shows no mass, no swelling and no tenderness. Left testis shows tenderness.  Genitourinary Comments: Tenderness upon palpation of lower pole left testicle along epididymus, ? 1 cm cyst. No testicular edema or scrotum erythema appreciated.  Musculoskeletal: He exhibits no edema or tenderness.  No major deformities appreciated and no signs of synovitis.  Lymphadenopathy:    He has no cervical adenopathy.       Right: No inguinal and no supraclavicular adenopathy present.       Left: No inguinal and no supraclavicular adenopathy present.  Neurological: He is alert and oriented to person, place, and time. He has normal strength. No cranial nerve deficit or sensory deficit. Coordination and gait normal.  Skin: Skin is warm. No erythema.  Psychiatric: His speech is normal. His mood appears anxious. Cognition and memory are normal.  Well groomed, good eye contact.        ASSESSMENT AND PLAN:   Discussed the following assessment and plan:   Tavaras was seen today for annual  exam.  Diagnoses and all orders for this visit:  Routine general medical examination at a health care facility  Current preventive preventive guidelines discussed. Discussed the importance of a healthy diet and regular exercise for prevention of some chronic medical conditions. Tdap 07/13/2011.  Next routine physical in 1-2 years.   Left testicular pain  We discussed possible causes, he seems a mild epididymitis, because of risk factors for STDs I recommended conservative treatment (loose underwear,no biking,local heat, and OTC NSAID's). I instructed him to let me know per my chart in about 7-10 days if pain isn't any better, before if worse. I may consider testicular ultrasound. Instructed about warning signs.  Hyperlipidemia  Continue nonpharmacologic treatment. Follow-up in 12 months.        Return in 1 year (on 09/27/2016) for routine+ fasting labs.    [place] [employer] G. Martinique, MD  [facility] [place] office.

## 2016-06-02 ENCOUNTER — Encounter: Payer: Self-pay | Admitting: Family Medicine

## 2016-06-02 ENCOUNTER — Ambulatory Visit (INDEPENDENT_AMBULATORY_CARE_PROVIDER_SITE_OTHER): Payer: Commercial Managed Care - HMO | Admitting: Family Medicine

## 2016-06-02 VITALS — BP 110/62 | HR 76 | Resp 12 | Ht 67.72 in | Wt 163.1 lb

## 2016-06-02 DIAGNOSIS — M545 Low back pain: Secondary | ICD-10-CM | POA: Diagnosis not present

## 2016-06-02 DIAGNOSIS — M5416 Radiculopathy, lumbar region: Secondary | ICD-10-CM | POA: Diagnosis not present

## 2016-06-02 DIAGNOSIS — R102 Pelvic and perineal pain: Secondary | ICD-10-CM | POA: Diagnosis not present

## 2016-06-02 MED ORDER — KETOROLAC TROMETHAMINE 60 MG/2ML IM SOLN
60.0000 mg | Freq: Once | INTRAMUSCULAR | Status: AC
  Administered 2016-06-02: 60 mg via INTRAMUSCULAR

## 2016-06-02 MED ORDER — PREDNISONE 20 MG PO TABS: ORAL_TABLET | ORAL | 0 refills | Status: AC

## 2016-06-02 MED ORDER — METHOCARBAMOL 500 MG PO TABS: 500.0000 mg | ORAL_TABLET | Freq: Three times a day (TID) | ORAL | 1 refills | Status: AC | PRN

## 2016-06-02 NOTE — Patient Instructions (Signed)
A few things to remember from today's visit:   Acute bilateral low back pain, with sciatica presence unspecified - Plan: predniSONE (DELTASONE) 20 MG tablet, methocarbamol (ROBAXIN) 500 MG tablet, MR LUMBAR SPINE WO CONTRAST  Perineal pain in male - Plan: predniSONE (DELTASONE) 20 MG tablet, MR LUMBAR SPINE WO CONTRAST  Lumbar radicular pain - Plan: predniSONE (DELTASONE) 20 MG tablet, MR LUMBAR SPINE WO CONTRAST    Back pain is very common in adults.The cause of back pain is rarely dangerous and the pain often gets better over time even with no pharmacologic treatment.  The cause of your back pain may not be known. Some common causes of back pain include: 1. Strain of the muscles or ligaments supporting the spine. 2. Wear and tear (degeneration) of the spinal disks. 3. Arthritis. 4. Direct injury to the back.  For many people, back pain may return. Since back pain is rarely dangerous, most people can learn to manage this condition on their own.  HOME CARE INSTRUCTIONS Watch your back pain for any changes. The following actions may help to lessen any discomfort you are feeling:  1. Remain active. It is stressful on your back to sit or stand in one place for long periods of time. Do not sit, drive, or stand in one place for more than 30 minutes at a time. Take short walks on even surfaces as soon as you are able.Try to increase the length of time you walk each day.  2. Exercise regularly as directed by your health care provider. Exercise helps your back heal faster. It also helps avoid future injury by keeping your muscles strong and flexible.  3. Do not stay in bed.Resting more than 1-2 days can delay your recovery.                                                      4. Pay attention to your body when you bend and lift. The most comfortable positions are those that put less stress on your recovering back.  5.  Always use proper lifting techniques, including: Bending your  knees. Keeping the load close to your body. Avoiding twisting.  6. Find a comfortable position to sleep. Use a firm mattress and lie on your side with your knees slightly bent. If you lie on your back, put a pillow under your knees.  7. Over the counter rubbing medications like Icy Hot or Asper cream with Lidocaine may help without significant side effects.  Acetaminophen and/or Aleve/Ibuprofen can be taken if needed and if not contraindications. Local ice and heat may be alternated to reduce pain and spasms. Also massage and even chiropractor treatment.      Muscle relaxants might or might not help, they cause drowsiness among other    side effects. They could also interact with some of medications you may be already taking (medications for depression/anxiety and some pain medications).   8. Maintain a healthy weight. Excess weight puts extra stress on your back and makes it difficult to maintain good posture.   SEEK MEDICAL CARE IF: worsening pain, associated fever, rash/edema on area, pain going to legs or buttocks, numbness/tingling, night pain, or abnormal weight loss.    SEEK IMMEDIATE MEDICAL CARE IF:  1. You develop new bowel or bladder control problems. 2. You have unusual weakness or numbness  in your arms or legs. 3. You develop nausea or vomiting. 4. You develop abdominal pain. 5. You feel faint.     Back Exercises The following exercises strengthen the muscles that help to support the back. They also help to keep the lower back flexible. Doing these exercises can help to prevent back pain or lessen existing pain. If you have back pain or discomfort, try doing these exercises 2-3 times each day or as told by your health care provider. When the pain goes away, do them once each day, but increase the number of times that you repeat the steps for each exercise (do more repetitions). If you do not have back pain or discomfort, do these exercises once each day or as told by your  health care provider.   EXERCISES Single Knee to Chest Repeat these steps 3-5 times for each leg: 5. Lie on your back on a firm bed or the floor with your legs extended. 6. Bring one knee to your chest. Your other leg should stay extended and in contact with the floor. 7. Hold your knee in place by grabbing your knee or thigh. 8. Pull on your knee until you feel a gentle stretch in your lower back. 9. Hold the stretch for 10-30 seconds. 10. Slowly release and straighten your leg.  Pelvic Tilt Repeat these steps 5-10 times: 2. Lie on your back on a firm bed or the floor with your legs extended. 3. Bend your knees so they are pointing toward the ceiling and your feet are flat on the floor. 4. Tighten your lower abdominal muscles to press your lower back against the floor. This motion will tilt your pelvis so your tailbone points up toward the ceiling instead of pointing to your feet or the floor. 5. With gentle tension and even breathing, hold this position for 5-10 seconds.  Cat-Cow Repeat these steps until your lower back becomes more flexible: 1. Get into a hands-and-knees position on a firm surface. Keep your hands under your shoulders, and keep your knees under your hips. You may place padding under your knees for comfort. 2. Let your head hang down, and point your tailbone toward the floor so your lower back becomes rounded like the back of a cat. 3. Hold this position for 5 seconds. 4. Slowly lift your head and point your tailbone up toward the ceiling so your back forms a sagging arch like the back of a cow. 5. Hold this position for 5 seconds.   Press-Ups Repeat these steps 5-10 times: 6. Lie on your abdomen (face-down) on the floor. 7. Place your palms near your head, about shoulder-width apart. 8. While you keep your back as relaxed as possible and keep your hips on the floor, slowly straighten your arms to raise the top half of your body and lift your shoulders. Do not use  your back muscles to raise your upper torso. You may adjust the placement of your hands to make yourself more comfortable. 9. Hold this position for 5 seconds while you keep your back relaxed. 10. Slowly return to lying flat on the floor.   Bridges Repeat these steps 10 times: 1. Lie on your back on a firm surface. 2. Bend your knees so they are pointing toward the ceiling and your feet are flat on the floor. 3. Tighten your buttocks muscles and lift your buttocks off of the floor until your waist is at almost the same height as your knees. You should feel the muscles  working in your buttocks and the back of your thighs. If you do not feel these muscles, slide your feet 1-2 inches farther away from your buttocks. 4. Hold this position for 3-5 seconds. 5. Slowly lower your hips to the starting position, and allow your buttocks muscles to relax completely. If this exercise is too easy, try doing it with your arms crossed over your chest.    Please be sure medication list is accurate. If a new problem present, please set up appointment sooner than planned today.

## 2016-06-02 NOTE — Progress Notes (Signed)
HPI:   ACUTE VISIT:  Chief Complaint  Patient presents with  . Back Pain    [name] is a 41 y.o. male, who is here today complaining of 4 weeks of lower back pain, which has ben radiated to inguinal area bilateral and testicles for the past week. He denies testicular masses,erythema,edema,or pain with palpation. He denies urinary symptoms.  On 05/09/16 he was playing softball, running to a base and felt  A little bit" of pain but mild, no limitations and did not have to leave game. "A few" days later he noted stiffness, bilateral lower back pain , not radiated initially.  He applied heating pad and did some stretching exercises but did not seem to help.  No associated LE numbness, tingling, urinary incontinence or retention, stool incontinence, or saddle anesthesia.   Back Pain  This is a recurrent problem. The current episode started 1 to 4 weeks ago. The problem occurs constantly. The problem has been gradually worsening since onset. The pain is present in the lumbar spine. The quality of the pain is described as aching. The pain is at a severity of 7/10. The pain is moderate. The pain is the same all the time. The symptoms are aggravated by bending and twisting. Stiffness is present all day. Associated symptoms include pelvic pain. Pertinent negatives include no abdominal pain, bladder incontinence, bowel incontinence, chest pain, dysuria, fever, headaches, leg pain, numbness, paresis, paresthesias, perianal numbness, tingling or weakness. He has tried NSAIDs and heat for the symptoms. The treatment provided no relief.   Alleviated by rest.  No rash or edema on area, fever, chills, or abnormal wt loss. Prior Hx of back pain: He has had back pain a couple times in the past but usually resolved in a couple days.  Pain is getting worse.  He has not taken any analgesic today.  Review of Systems  Constitutional: Negative for appetite change, chills, fatigue and  fever.  Respiratory: Negative for shortness of breath.   Cardiovascular: Negative for chest pain and leg swelling.  Gastrointestinal: Negative for abdominal pain, bowel incontinence, nausea and vomiting.  Genitourinary: Positive for pelvic pain and testicular pain. Negative for bladder incontinence, decreased urine volume, discharge, dysuria, flank pain, frequency, hematuria and scrotal swelling.  Musculoskeletal: Positive for back pain. Negative for gait problem and neck pain.  Skin: Negative for color change and rash.  Neurological: Negative for tingling, weakness, numbness, headaches and paresthesias.  Psychiatric/Behavioral: Negative for confusion. The patient is nervous/anxious.     No current outpatient prescriptions on file prior to visit.   No current facility-administered medications on file prior to visit.     Past Medical History:  Diagnosis Date  . Anxiety   . Hyperlipidemia    Allergies  Allergen Reactions  . Codeine Other (See Comments)    itchy    Social History   Social History  . Marital status: Married    Spouse name: N/A  . Number of children: N/A  . Years of education: N/A   Social History Main Topics  . Smoking status: Light Tobacco Smoker    Types: Cigars  . Smokeless tobacco: Never Used     Comment: occasional cigar  . Alcohol use No  . Drug use: No  . Sexual activity: Yes   Other Topics Concern  . None   Social History Narrative  . None    Vitals:   06/02/16 0751  BP: 110/62  Pulse: 76  Resp: 12  Body mass index is 25.01 kg/m.   Physical Exam  Nursing note and vitals reviewed. Constitutional: He is oriented to person, place, and time. He appears well-developed and well-nourished. No distress.  HENT:  Head: Atraumatic.  Mouth/Throat: Oropharynx is clear and moist and mucous membranes are normal.  Eyes: Conjunctivae and EOM are normal.  Cardiovascular: Normal rate and regular rhythm.   No murmur heard. Respiratory: Effort  normal and breath sounds normal. No respiratory distress.  GI: Soft. He exhibits no mass. There is no hepatomegaly. There is no tenderness.  Musculoskeletal: He exhibits no edema.  No significant deformity appreciated. No tenderness upon palpation of paraspinal muscles. Pain elicited with movement on exam table during examination. No local edema or erythema appreciated, no suspicious lesions.   Neurological: He is alert and oriented to person, place, and time. He has normal strength. Coordination normal.  Reflex Scores:      Patellar reflexes are 2+ on the right side and 2+ on the left side. SLR elicits back pain, R>L. He can walk on tip toes and heels. Antalgic gait.  Skin: Skin is warm. No erythema.  Psychiatric: His mood appears anxious.  Well groomed,good eye contact.     ASSESSMENT AND PLAN:   [name] was seen today for back pain.  Diagnoses and all orders for this visit:  Acute bilateral low back pain, with sciatica presence unspecified  After verbal consent he received Toradol 60 mg IM. He can continue OTC Ibuprofen in 6-8 hours to continue tid with food. I discussed some side effects of muscle relaxants. Relative rest.   -     predniSONE (DELTASONE) 20 MG tablet; 3 tabs for 3 days, 2 tabs for 3 days, 1 tabs for 3 days, and 1/2 tab for 3 days. Take tables together with breakfast. -     methocarbamol (ROBAXIN) 500 MG tablet; Take 1 tablet (500 mg total) by mouth every 8 (eight) hours as needed for muscle spasms. -     MR LUMBAR SPINE WO CONTRAST; Future  Perineal pain in male  He does same think genital examination is necessary at this time given the fact he recently had a CPE and he has not noted any deformity or abnormality on inspection. Instructed about warning signs.  -     predniSONE (DELTASONE) 20 MG tablet; 3 tabs for 3 days, 2 tabs for 3 days, 1 tabs for 3 days, and 1/2 tab for 3 days. Take tables together with breakfast. -     MR LUMBAR SPINE WO CONTRAST;  Future  Lumbar radicular pain  Because radiation to inguinal area and perineal area, I recommend Prednisone taper. Some side effects of medication discussed. Lumbar MRI will be arranged. Clearly instructed about warning signs. Further recommendations will be given according to imaging results.  -     predniSONE (DELTASONE) 20 MG tablet; 3 tabs for 3 days, 2 tabs for 3 days, 1 tabs for 3 days, and 1/2 tab for 3 days. Take tables together with breakfast. -     MR LUMBAR SPINE WO CONTRAST; Future      -[name] advised to seek immediate medical attention is symptoms suddenly get worse or to follow if new concerns arise.       Betty G. Martinique, MD  [facility]. [place] office.

## 2016-06-16 ENCOUNTER — Ambulatory Visit
Admission: RE | Admit: 2016-06-16 | Discharge: 2016-06-16 | Disposition: A | Discharge status: Home / Self Care | Payer: Commercial Managed Care - HMO | Source: Ambulatory Visit | Attending: Family Medicine | Admitting: Family Medicine

## 2016-06-16 DIAGNOSIS — S3992XA Unspecified injury of lower back, initial encounter: Secondary | ICD-10-CM | POA: Diagnosis not present

## 2016-06-16 DIAGNOSIS — M5416 Radiculopathy, lumbar region: Secondary | ICD-10-CM

## 2016-06-16 DIAGNOSIS — M545 Low back pain: Secondary | ICD-10-CM

## 2016-06-16 DIAGNOSIS — R102 Pelvic and perineal pain: Secondary | ICD-10-CM

## 2016-06-21 ENCOUNTER — Other Ambulatory Visit: Payer: Self-pay

## 2016-06-21 DIAGNOSIS — M5416 Radiculopathy, lumbar region: Secondary | ICD-10-CM

## 2016-09-26 NOTE — Progress Notes (Signed)
HPI:  Mr. Glyn Gerads is a 41 y.o.male here today for his routine physical examination.  Last CPE: 09/28/15.  He lives with his wife and daughter.  Regular exercise 3 or more times per week: Not consistently. Following a healthy diet: Yes.   Chronic medical problems: Back pain, HLD.   Lab Results  Component Value Date   CHOL 199 09/21/2015   HDL 52.30 09/21/2015   LDLCALC 119 (H) 09/21/2015   TRIG 139.0 09/21/2015   CHOLHDL 4 09/21/2015   He is still having lower back pain but mild. It is exacerbated by long outdoor activities that involveending and lifting. Alleviated by stretching. He did not see ortho. According to pt,  It was some confusion about appt time,since back pain improved he did not re-schedule appt.    Hx of STD's: Denies   -Denies high alcohol intake or Hx of illicit drug use. He drinks about 4 beers daily during weekends. Smokes cigar occasionally.   -Concerns and/or follow up today:  Left temporal scalp soreness for the past couple days. He think he bumped head against something while he was working outdoors. He denies associated visual changes, ear ache, numbness, tingling, rash, nausea, vomiting, or focal weakness. He has not taking any OTC medication. Pain is intermittently, exacerbated by palpation. He doesn't have pain unless he palpates area.   Review of Systems  Constitutional: Negative for activity change, appetite change, fatigue and fever.  HENT: Negative for dental problem, nosebleeds, sore throat, trouble swallowing and voice change.   Eyes: Negative for redness and visual disturbance.  Respiratory: Negative for apnea, cough, shortness of breath and wheezing.   Cardiovascular: Negative for chest pain, palpitations and leg swelling.  Gastrointestinal: Negative for abdominal pain, blood in stool, nausea and vomiting.  Endocrine: Negative for polydipsia and polyuria.  Genitourinary: Negative for decreased urine volume,  dysuria, genital sores, hematuria and testicular pain.  Musculoskeletal: Positive for back pain. Negative for arthralgias, joint swelling and myalgias.  Skin: Negative for color change and rash.  Neurological: Negative for dizziness, seizures, syncope, weakness, numbness and headaches.  Hematological: Negative for adenopathy. Does not bruise/bleed easily.  Psychiatric/Behavioral: Negative for confusion and sleep disturbance. The patient is not nervous/anxious.   All other systems reviewed and are negative.    No current outpatient prescriptions on file prior to visit.   No current facility-administered medications on file prior to visit.      Past Medical History:  Diagnosis Date  . Anxiety   . Hyperlipidemia     Past Surgical History:  Procedure Laterality Date  . KNEE SURGERY Right 2010    Allergies  Allergen Reactions  . Codeine Other (See Comments)    itchy    Family History  Problem Relation Age of Onset  . Anxiety disorder Mother   . Hypertension Mother     Social History   Social History  . Marital status: Married    Spouse name: N/A  . Number of children: N/A  . Years of education: N/A   Social History Main Topics  . Smoking status: Light Tobacco Smoker    Types: Cigars  . Smokeless tobacco: Never Used     Comment: occasional cigar  . Alcohol use No  . Drug use: No  . Sexual activity: Yes   Other Topics Concern  . None   Social History Narrative  . None     Vitals:   09/27/16 0810  BP: 124/80  Pulse: 60  Resp:  12  SpO2: 97%   Body mass index is 26.12 kg/m.   Wt Readings from Last 3 Encounters:  09/27/16 170 lb 6 oz (77.3 kg)  06/02/16 163 lb 2 oz (74 kg)  09/28/15 160 lb 6 oz (72.7 kg)     Physical Exam  Nursing note and vitals reviewed. Constitutional: He is oriented to person, place, and time. He appears well-developed and well-nourished. No distress.  HENT:  Head: Normocephalic and atraumatic.  Right Ear: Hearing,  tympanic membrane, external ear and ear canal normal.  Left Ear: Hearing, tympanic membrane, external ear and ear canal normal.  Mouth/Throat: Oropharynx is clear and moist and mucous membranes are normal.  No skull deformities or localized edema or erythema. Affected area left parieto occipital, with mild tenderness upon palpation, no crepitus or masses appreciated.  Eyes: Pupils are equal, round, and reactive to light. Conjunctivae and EOM are normal.  Neck: Normal range of motion. No tracheal deviation present. No thyromegaly present.  Cardiovascular: Normal rate and regular rhythm.   No murmur heard. Pulses:      Dorsalis pedis pulses are 2+ on the right side, and 2+ on the left side.  Respiratory: Effort normal and breath sounds normal. No respiratory distress.  GI: Soft. He exhibits no mass. There is no hepatomegaly. There is no tenderness.  Genitourinary:  Genitourinary Comments: Deferred, no concerns.  Musculoskeletal: He exhibits no edema or tenderness.  No major deformities appreciated and no signs of synovitis.  Lymphadenopathy:    He has no cervical adenopathy.       Right: No supraclavicular adenopathy present.       Left: No supraclavicular adenopathy present.  Neurological: He is alert and oriented to person, place, and time. He has normal strength. No cranial nerve deficit or sensory deficit. Coordination and gait normal.  Reflex Scores:      Bicep reflexes are 2+ on the right side and 2+ on the left side.      Patellar reflexes are 2+ on the right side and 2+ on the left side. Skin: Skin is warm. No rash noted. No erythema.  Psychiatric: His mood appears anxious.  Well groomed,good eye contact.    ASSESSMENT AND PLAN:   Ms.Kier was seen today for annual exam.  Diagnoses and all orders for this visit:  Lab Results  Component Value Date   CHOL 218 (H) 09/27/2016   HDL 51.10 09/27/2016   LDLCALC 132 (H) 09/27/2016   TRIG 178.0 (H) 09/27/2016   CHOLHDL 4  09/27/2016   Lab Results  Component Value Date   CREATININE 0.98 09/27/2016   BUN 13 09/27/2016   NA 138 09/27/2016   K 4.1 09/27/2016   CL 103 09/27/2016   CO2 30 09/27/2016    Routine general medical examination at a health care facility  We discussed the importance of regular physical activity and healthy diet for prevention of chronic illness and/or complications. Preventive guidelines reviewed. Vaccination up to date per pt report.  Next CPE in 1-2 years.  Hyperlipidemia, unspecified hyperlipidemia type  Continue non pharmacologic treatment for now. We will follow labs done today and will give further recommendations accordingly.  -     Lipid panel  Diabetes mellitus screening -     Basic metabolic panel  Encounter for screening for HIV -     HIV antibody  Scalp tenderness  ? Related to mild trauma. Improving. I do nit think imaging is needed at this time. Monitor for warning signs. F/U as  needed.    Return in 1 year (on 09/27/2017).    Cami Delawder G. Martin[place], MD  [facility]. [place] office.

## 2016-09-27 ENCOUNTER — Encounter: Payer: Self-pay | Admitting: Family Medicine

## 2016-09-27 ENCOUNTER — Ambulatory Visit (INDEPENDENT_AMBULATORY_CARE_PROVIDER_SITE_OTHER): Payer: 59 | Admitting: Family Medicine

## 2016-09-27 VITALS — BP 124/80 | HR 60 | Resp 12 | Ht 67.72 in | Wt 170.4 lb

## 2016-09-27 DIAGNOSIS — Z114 Encounter for screening for human immunodeficiency virus [HIV]: Secondary | ICD-10-CM

## 2016-09-27 DIAGNOSIS — Z131 Encounter for screening for diabetes mellitus: Secondary | ICD-10-CM | POA: Diagnosis not present

## 2016-09-27 DIAGNOSIS — Z0000 Encounter for general adult medical examination without abnormal findings: Secondary | ICD-10-CM | POA: Diagnosis not present

## 2016-09-27 DIAGNOSIS — R51 Headache: Secondary | ICD-10-CM | POA: Diagnosis not present

## 2016-09-27 DIAGNOSIS — E785 Hyperlipidemia, unspecified: Secondary | ICD-10-CM | POA: Diagnosis not present

## 2016-09-27 DIAGNOSIS — R519 Headache, unspecified: Secondary | ICD-10-CM

## 2016-09-27 LAB — BASIC METABOLIC PANEL
BUN: 13 mg/dL (ref 6–23)
CHLORIDE: 103 meq/L (ref 96–112)
CO2: 30 meq/L (ref 19–32)
Calcium: 9.3 mg/dL (ref 8.4–10.5)
Creatinine, Ser: 0.98 mg/dL (ref 0.40–1.50)
GFR: 90.51 mL/min (ref >60.00)
Glucose, Bld: 92 mg/dL (ref 70–99)
POTASSIUM: 4.1 meq/L (ref 3.5–5.1)
Sodium: 138 mEq/L (ref 135–145)

## 2016-09-27 LAB — LIPID PANEL
CHOL/HDL RATIO: 4
CHOLESTEROL: 218 mg/dL — AB (ref 0–200)
HDL: 51.1 mg/dL (ref >39.00)
LDL CALC: 132 mg/dL — AB (ref 0–99)
NonHDL: 167.12
TRIGLYCERIDES: 178 mg/dL — AB (ref 0.0–149.0)
VLDL: 35.6 mg/dL (ref 0.0–40.0)

## 2016-09-27 NOTE — Patient Instructions (Signed)
A few things to remember from today's visit:   Routine general medical examination at a health care facility  Hyperlipidemia, unspecified hyperlipidemia type - Plan: Lipid panel  Diabetes mellitus screening - Plan: Basic metabolic panel  Encounter for screening for HIV - Plan: HIV antibody    At least 150 minutes of moderate exercise per week, daily brisk walking for 15-30 min is a good exercise option. Healthy diet low in saturated (animal) fats and sweets and consisting of fresh fruits and vegetables, lean meats such as fish and white chicken and whole grains.  - Vaccines:  Tdap vaccine every 10 years.  Shingles vaccine recommended at age 45, could be given after 39 years of age but not sure about insurance coverage.  Pneumonia vaccines:  Prevnar 44 at 13 and Pneumovax at 2.   -Screening recommendations for low/normal risk males:  Screening for diabetes at age 40-45 and every 3 years.    Lipid screening at 35 and every 3 years.   Colonoscopy for colon cancer screening at age 69 and until age 87.  Prostate cancer screening: some controversy.         Please be sure medication list is accurate. If a new problem present, please set up appointment sooner than planned today.

## 2016-09-28 LAB — HIV ANTIBODY (ROUTINE TESTING W REFLEX): HIV 1&2 Ab, 4th Generation: NONREACTIVE

## 2017-01-17 ENCOUNTER — Ambulatory Visit: Payer: 59 | Admitting: Family Medicine

## 2017-01-17 ENCOUNTER — Encounter: Payer: Self-pay | Admitting: Family Medicine

## 2017-01-17 VITALS — BP 120/77 | HR 72 | Temp 98.8°F | Resp 12 | Ht 67.72 in | Wt 174.0 lb

## 2017-01-17 DIAGNOSIS — R0781 Pleurodynia: Secondary | ICD-10-CM | POA: Diagnosis not present

## 2017-01-17 DIAGNOSIS — M954 Acquired deformity of chest and rib: Secondary | ICD-10-CM | POA: Diagnosis not present

## 2017-01-17 DIAGNOSIS — IMO0002 Reserved for concepts with insufficient information to code with codable children: Secondary | ICD-10-CM

## 2017-01-17 NOTE — Patient Instructions (Addendum)
A few things to remember from today's visit:   Costal margin pain - Plan: DG Ribs Unilateral W/Chest Right  Deformity of thoracic structure - Plan: [place] CHEST SOFT TISSUE  Today X ray was ordered.  This can be done at [facility] at [facility] between 8 am and 5 pm: [place]. [phone].   Please be sure medication list is accurate. If a new problem present, please set up appointment sooner than planned today.

## 2017-01-17 NOTE — Progress Notes (Signed)
ACUTE VISIT   HPI:  Chief Complaint  Patient presents with  . Right-sided rib cage discomfort    no pain just discomfort, feels like it is swelling, have been going on for about a year, but getting worse for the last 3 to 6 months    [place] is a 42 y.o. male, who is here today complaining of about 6 months or more of mild prominence noted on the right axillary area.  "It looks a little different."   He also has some "discomfort" for the past 3 months, mild achy sensation, 2/10, no radiated around ribs in same area. He cannot pinpoint area of discomfort.  He has not identified exacerbating or alleviating factors. She is not sure if problem is getting worse.  He has no noted fever, chills, abnormal weight loss, cough, dyspnea, wheezing, or arthralgias. He denies any history of trauma or skin changes. He has not tried OTC medications for problems for this problem.   Review of Systems  Constitutional: Negative for activity change, appetite change, fatigue, fever and unexpected weight change.  HENT: Negative for nosebleeds, sore throat and trouble swallowing.   Eyes: Negative for redness and visual disturbance.  Respiratory: Negative for cough, shortness of breath and wheezing.   Cardiovascular: Negative for chest pain, palpitations and leg swelling.  Gastrointestinal: Negative for abdominal pain, nausea and vomiting.  Genitourinary: Negative for decreased urine volume, dysuria and hematuria.  Musculoskeletal: Negative for back pain and gait problem.  Skin: Negative for rash.  Neurological: Negative for weakness, numbness and headaches.  Psychiatric/Behavioral: Negative for confusion. The patient is nervous/anxious.       No current outpatient medications on file prior to visit.   No current facility-administered medications on file prior to visit.      Past Medical History:  Diagnosis Date  . Anxiety   . Hyperlipidemia    Allergies  Allergen  Reactions  . Codeine Other (See Comments)    itchy    Social History   Socioeconomic History  . Marital status: Married    Spouse name: None  . Number of children: None  . Years of education: None  . Highest education level: None  Social Needs  . Financial resource strain: None  . Food insecurity - worry: None  . Food insecurity - inability: None  . Transportation needs - medical: None  . Transportation needs - non-medical: None  Occupational History  . None  Tobacco Use  . Smoking status: Light Tobacco Smoker    Types: Cigars  . Smokeless tobacco: Never Used  . Tobacco comment: occasional cigar  Substance and Sexual Activity  . Alcohol use: No  . Drug use: No  . Sexual activity: Yes  Other Topics Concern  . None  Social History Narrative  . None    Vitals:   01/17/17 0830  BP: 120/77  Pulse: 72  Resp: 12  Temp: 98.8 F (37.1 C)  SpO2: 98%   Body mass index is 26.68 kg/m.    Physical Exam  Nursing note and vitals reviewed. Constitutional: He is oriented to person, place, and time. He appears well-developed and well-nourished. No distress.  HENT:  Head: Normocephalic and atraumatic.  Eyes: Conjunctivae are normal.  Cardiovascular: Normal rate and regular rhythm.  Respiratory: Effort normal and breath sounds normal. No respiratory distress. He exhibits no tenderness.  Musculoskeletal: He exhibits no edema.       Thoracic back: He exhibits no tenderness and no bony  tenderness.  Shoulder: No deformity, edema, or erythema appreciated.No muscle atrophy. Mild tenderness upon palpation of right posterior ribs, 7th-8th. No skin changes or masses appreciated,no deformities. No tenderness upon palpation of paraspinal muscles. Mild prominence noted under right axillary area,between ant and mid axillary lines.No tender, borders not well defined, and seems mobile.   Lymphadenopathy:    He has no cervical adenopathy.       Right axillary: No pectoral and no lateral  adenopathy present.       Right: No supraclavicular adenopathy present.  Neurological: He is alert and oriented to person, place, and time. He has normal strength. Gait normal.  Skin: No rash noted. No erythema.  Psychiatric: His mood appears anxious.  Well-groomed, good eye contact.    ASSESSMENT AND PLAN:    Mr. Treshon was seen today for right-sided rib cage discomfort.  Diagnoses and all orders for this visit:  Costal margin pain  Possible etiologies discussed, costochondritis. Examination today does not suggest a serious process. Instructed to monitor for new associated symptoms.  Further recommendations will be given according to imaging results.  -     DG Ribs Unilateral W/Chest Right; Future  Deformity of thoracic structure  Reassured, most likely benign, ? Lipoma. He is very concerned I would like some type of imaging done, so soft tissue US will be arranged. Instructed about warning signs.  -     [place] CHEST SOFT TISSUE; Future       -Mr.Woodard Perrell Probasco was advised to seek immediate medical attention if sudden worsening symptoms or to follow if they persist or if new concerns arise.       Marenda Accardi G. Martin[place], MD  [facility]. [place] office.

## 2017-01-22 ENCOUNTER — Ambulatory Visit (INDEPENDENT_AMBULATORY_CARE_PROVIDER_SITE_OTHER)
Admission: RE | Admit: 2017-01-22 | Discharge: 2017-01-22 | Disposition: A | Discharge status: Home / Self Care | Payer: 59 | Source: Ambulatory Visit | Attending: Family Medicine | Admitting: Family Medicine

## 2017-01-22 DIAGNOSIS — R0781 Pleurodynia: Secondary | ICD-10-CM | POA: Diagnosis not present

## 2017-01-30 ENCOUNTER — Other Ambulatory Visit: Payer: 59

## 2017-11-27 DIAGNOSIS — M79645 Pain in left finger(s): Secondary | ICD-10-CM | POA: Diagnosis not present

## 2017-11-28 DIAGNOSIS — M9902 Segmental and somatic dysfunction of thoracic region: Secondary | ICD-10-CM | POA: Diagnosis not present

## 2017-11-28 DIAGNOSIS — M9903 Segmental and somatic dysfunction of lumbar region: Secondary | ICD-10-CM | POA: Diagnosis not present

## 2017-11-28 DIAGNOSIS — M9905 Segmental and somatic dysfunction of pelvic region: Secondary | ICD-10-CM | POA: Diagnosis not present

## 2017-12-12 DIAGNOSIS — D485 Neoplasm of uncertain behavior of skin: Secondary | ICD-10-CM | POA: Diagnosis not present

## 2017-12-12 DIAGNOSIS — D225 Melanocytic nevi of trunk: Secondary | ICD-10-CM | POA: Diagnosis not present

## 2018-05-13 IMAGING — DX DG RIBS W/ CHEST 3+V*R*
4 series · 4 of 4 positions shown · non-contrast
Comparison: Chest x-ray of April 23, 2013

CLINICAL DATA: Persistent mid right lateral and posterior ribcage
pain for the past 3 months. No chest complaint. There is tenderness
to palpation and bony prominence of the chest wall in the
symptomatic region.

EXAM:
RIGHT RIBS AND CHEST - 3+ VIEW

[chest pa]
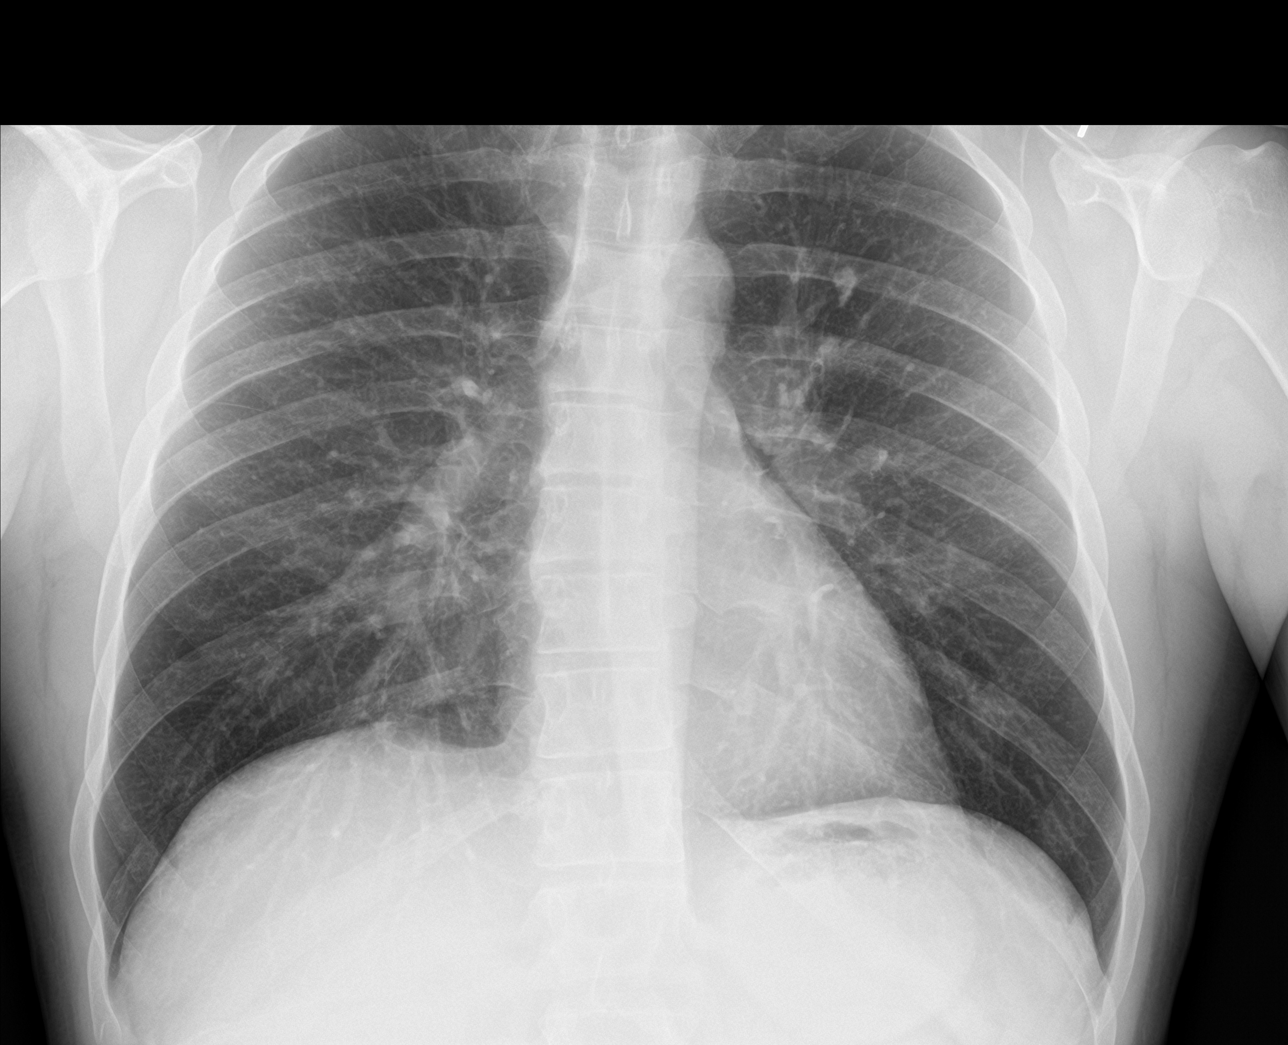

[rib pa (1 of 2)]
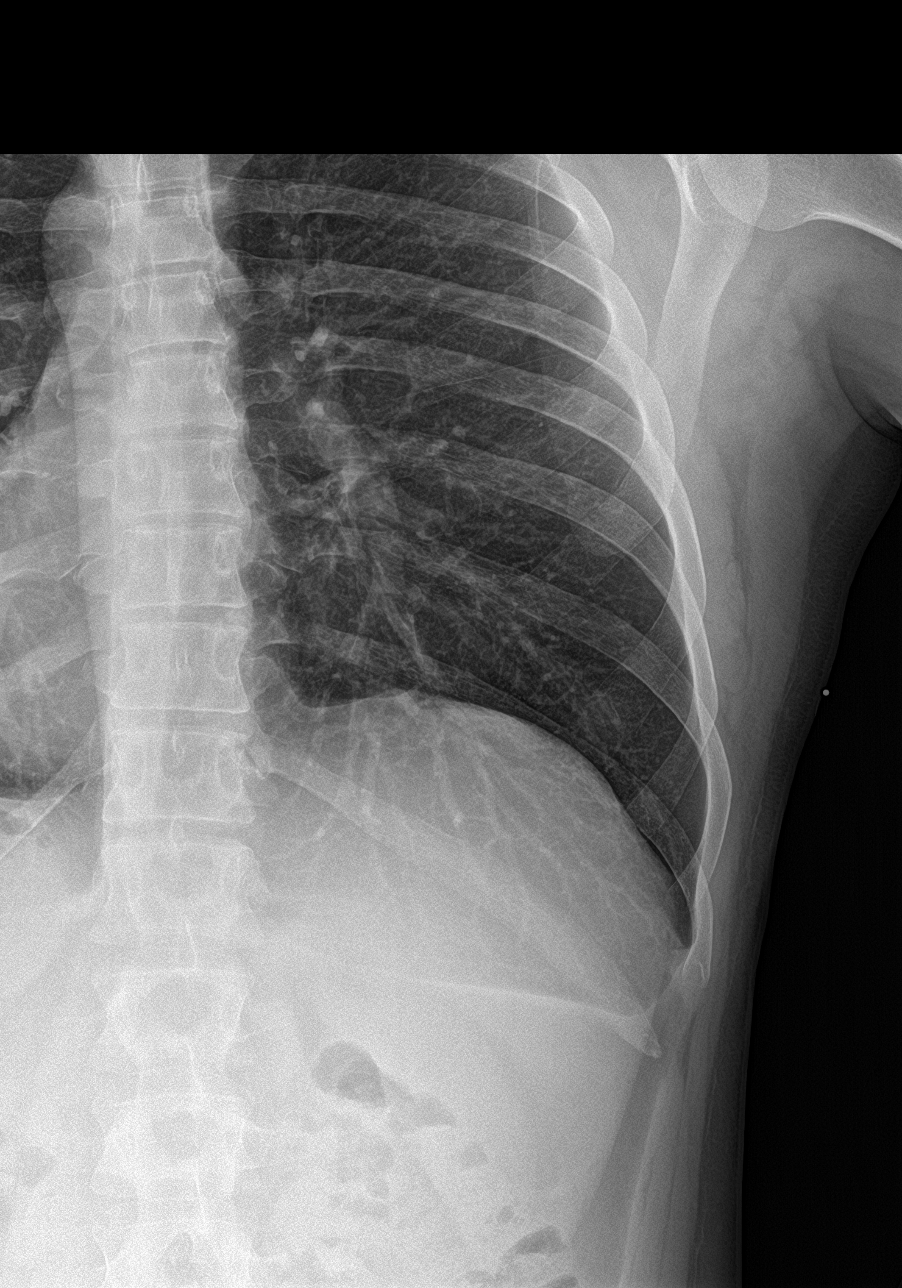

[rib pa (2 of 2)]
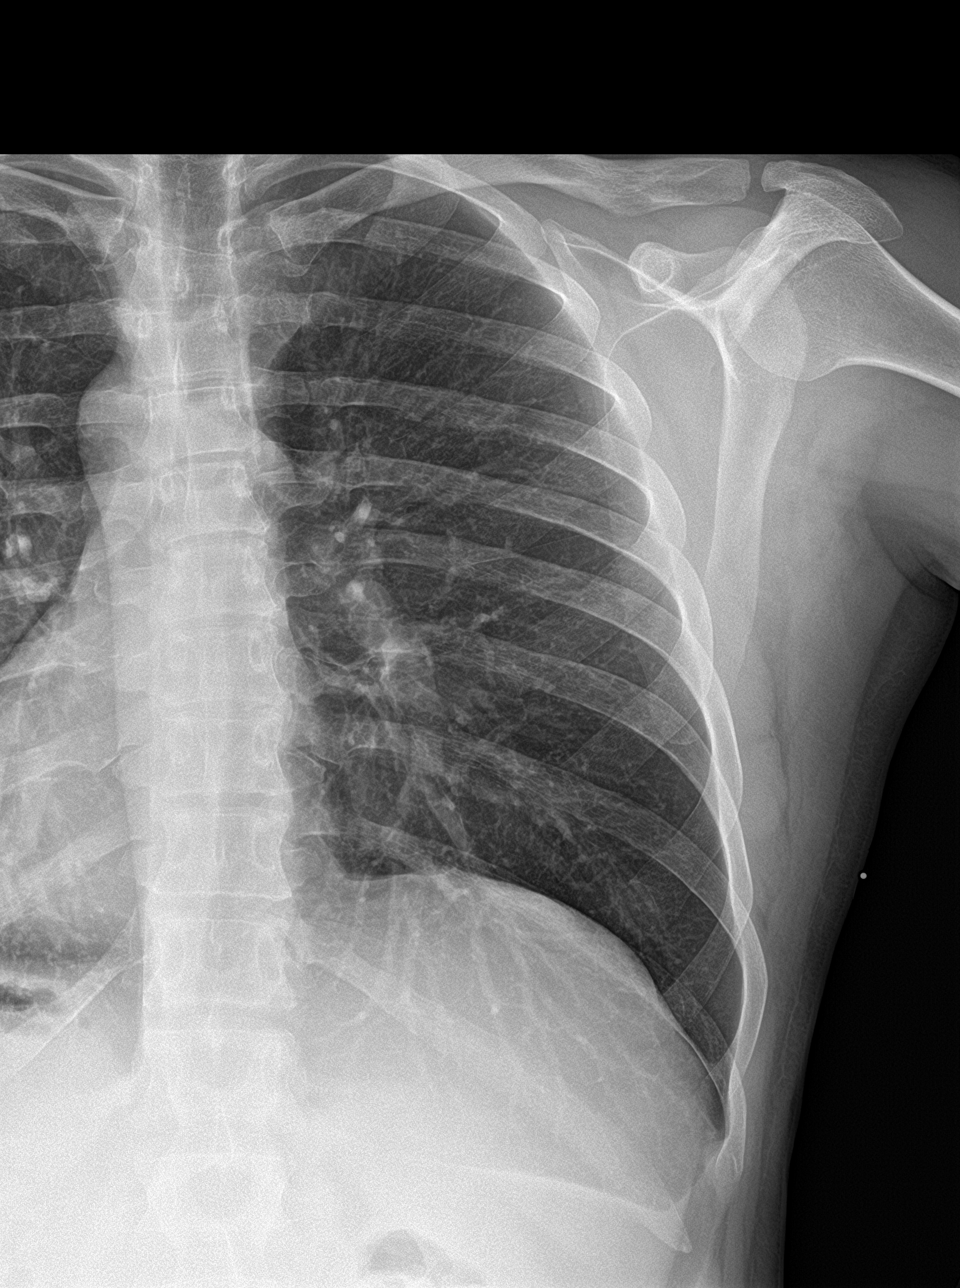

[rib obl]
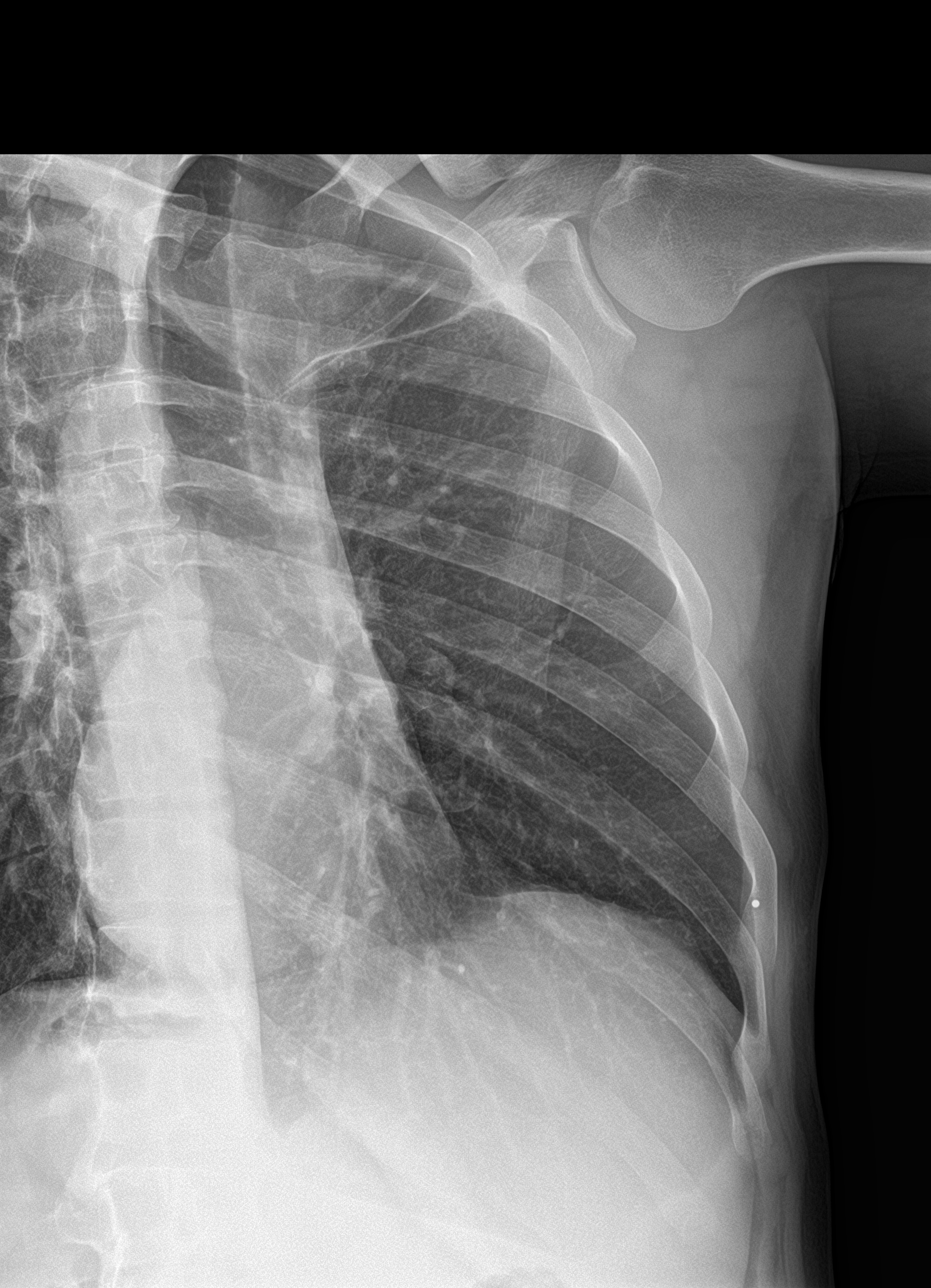

[4 of 4 positions shown; findings below may reference images not displayed]

FINDINGS: The lungs are adequately inflated. There is evidence of previous
granulomatous infection which is stable. There is no infiltrate,
atelectasis, or pleural effusion. The heart and pulmonary
vascularity are normal.

Right rib detail images reveal no acute or old rib fracture. There
is no lytic or blastic rib lesion.
IMPRESSION: There is no acute or significant chronic bony abnormality of the
right ribs. There is no acute cardiopulmonary abnormality.

## 2019-08-22 ENCOUNTER — Encounter: Payer: 59 | Admitting: Family Medicine

## 2019-09-02 ENCOUNTER — Encounter: Payer: Self-pay | Admitting: Family Medicine

## 2019-09-02 ENCOUNTER — Ambulatory Visit (INDEPENDENT_AMBULATORY_CARE_PROVIDER_SITE_OTHER): Payer: 59 | Admitting: Family Medicine

## 2019-09-02 ENCOUNTER — Other Ambulatory Visit: Payer: Self-pay

## 2019-09-02 VITALS — BP 126/80 | HR 62 | Temp 98.1°F | Resp 12 | Ht 67.72 in | Wt 175.5 lb

## 2019-09-02 DIAGNOSIS — E785 Hyperlipidemia, unspecified: Secondary | ICD-10-CM

## 2019-09-02 DIAGNOSIS — M545 Low back pain, unspecified: Secondary | ICD-10-CM

## 2019-09-02 DIAGNOSIS — Z13228 Encounter for screening for other metabolic disorders: Secondary | ICD-10-CM

## 2019-09-02 DIAGNOSIS — Z1329 Encounter for screening for other suspected endocrine disorder: Secondary | ICD-10-CM

## 2019-09-02 DIAGNOSIS — Z0000 Encounter for general adult medical examination without abnormal findings: Secondary | ICD-10-CM | POA: Diagnosis not present

## 2019-09-02 DIAGNOSIS — Z1159 Encounter for screening for other viral diseases: Secondary | ICD-10-CM | POA: Diagnosis not present

## 2019-09-02 DIAGNOSIS — Z130 Encounter for screening for diseases of the blood and blood-forming organs and certain disorders involving the immune mechanism: Secondary | ICD-10-CM

## 2019-09-02 DIAGNOSIS — S6992XA Unspecified injury of left wrist, hand and finger(s), initial encounter: Secondary | ICD-10-CM

## 2019-09-02 DIAGNOSIS — G8929 Other chronic pain: Secondary | ICD-10-CM

## 2019-09-02 NOTE — Progress Notes (Signed)
HPI:  [name] is a 44 y.o.male here today for his routine physical examination.  Last CPE: 09/27/16. He lives with his wife and daughter.  Regular exercise 3 or more times per week: Runs 2 times per week and walks "a lot"through the week. Following a healthy diet: Yes. He does not drink sodas.  Chronic medical problems: HLD and chronic back pain among some.  Negative for DM II,CAD,and HTN.  There is no immunization history on file for this patient.  -Hep C screening: Never.  Last colon cancer screening: N/A Last prostate ca screening: N/A  Negative for high alcohol intake or Hx of illicit drug use. Smoke cigars sometimes. 12 beers during weekends.  -Concerns and/or follow up today:   Back pain: Intermittent episodes of back pain. Completed PT, which helped.  He does stretching exercises at home, which help. Local heat. Exacerbated by some activities: Tennis,golf,and running exacerbate problem. Pain is not debilitating, it can be mild to moderate. Pain does not limit daily activities.  Lumbar MRI in 06/2016: 1. At L4-5 there is a mild broad-based disc bulge with a small central disc protrusion with mild impression on the ventral thecal sac. Mild bilateral facet arthropathy.  Lab Results  Component Value Date   CHOL 218 (H) 09/27/2016   HDL 51.10 09/27/2016   LDLCALC 132 (H) 09/27/2016   TRIG 178.0 (H) 09/27/2016   CHOLHDL 4 09/27/2016   Dislocated left 5th finger about 2 weeks ago when playing with daughter. Hhe did pull finger and phalanx back in place. He did not seek medical attention. No limitation of ROM, seems swollen but there is no pain.  Review of Systems  Constitutional: Negative for activity change, appetite change, fatigue and fever.  HENT: Negative for dental problem, mouth sores, nosebleeds and sore throat.   Eyes: Negative for redness and visual disturbance.  Respiratory: Negative for cough, shortness of breath and wheezing.     Cardiovascular: Negative for chest pain, palpitations and leg swelling.  Gastrointestinal: Negative for abdominal pain, blood in stool, nausea and vomiting.  Endocrine: Negative for cold intolerance, heat intolerance, polydipsia, polyphagia and polyuria.  Genitourinary: Negative for decreased urine volume, dysuria, genital sores, hematuria and testicular pain.  Musculoskeletal: Positive for arthralgias and back pain. Negative for gait problem.  Skin: Negative for color change and rash.  Neurological: Negative for syncope, weakness and headaches.  Hematological: Negative for adenopathy. Does not bruise/bleed easily.  Psychiatric/Behavioral: Negative for confusion and sleep disturbance. The patient is not nervous/anxious.    No current outpatient medications on file prior to visit.   No current facility-administered medications on file prior to visit.   Past Medical History:  Diagnosis Date  . Anxiety   . Hyperlipidemia     Past Surgical History:  Procedure Laterality Date  . KNEE SURGERY Right 2010    Allergies  Allergen Reactions  . Codeine Other (See Comments)    itchy    Family History  Problem Relation Age of Onset  . Anxiety disorder Mother   . Hypertension Mother     Social History   Socioeconomic History  . Marital status: Married    Spouse name: Not on file  . Number of children: Not on file  . Years of education: Not on file  . Highest education level: Not on file  Occupational History  . Not on file  Tobacco Use  . Smoking status: Light Tobacco Smoker    Types: Cigars  . Smokeless tobacco: Never Used  .  Tobacco comment: occasional cigar  Substance and Sexual Activity  . Alcohol use: No  . Drug use: No  . Sexual activity: Yes  Other Topics Concern  . Not on file  Social History Narrative  . Not on file   Social Determinants of Health   Financial Resource Strain:   . Difficulty of Paying Living Expenses: Not on file  Food Insecurity:   .  Worried About Charity fundraiser in the Last Year: Not on file  . Ran Out of Food in the Last Year: Not on file  Transportation Needs:   . Lack of Transportation (Medical): Not on file  . Lack of Transportation (Non-Medical): Not on file  Physical Activity:   . Days of Exercise per Week: Not on file  . Minutes of Exercise per Session: Not on file  Stress:   . Feeling of Stress : Not on file  Social Connections:   . Frequency of Communication with Friends and Family: Not on file  . Frequency of Social Gatherings with Friends and Family: Not on file  . Attends Religious Services: Not on file  . Active Member of Clubs or Organizations: Not on file  . Attends Archivist Meetings: Not on file  . Marital Status: Not on file   Vitals:   09/02/19 0924  BP: 126/80  Pulse: 62  Resp: 12  Temp: 98.1 F (36.7 C)  SpO2: 97%   Body mass index is 26.91 kg/m.  Wt Readings from Last 3 Encounters:  09/02/19 175 lb 8 oz (79.6 kg)  01/17/17 174 lb (78.9 kg)  09/27/16 170 lb 6 oz (77.3 kg)   Physical Exam Vitals and nursing note reviewed.  Constitutional:      General: He is not in acute distress.    Appearance: He is well-developed.  HENT:     Head: Normocephalic and atraumatic.     Right Ear: Tympanic membrane, ear canal and external ear normal.     Left Ear: Tympanic membrane, ear canal and external ear normal.     Mouth/Throat:     Mouth: Mucous membranes are moist.     Pharynx: Oropharynx is clear.  Eyes:     Extraocular Movements: Extraocular movements intact.     Conjunctiva/sclera: Conjunctivae normal.     Pupils: Pupils are equal, round, and reactive to light.  Neck:     Thyroid: No thyromegaly.     Trachea: No tracheal deviation.  Cardiovascular:     Rate and Rhythm: Normal rate and regular rhythm.     Pulses:          Dorsalis pedis pulses are 2+ on the right side and 2+ on the left side.     Heart sounds: No murmur heard.   Pulmonary:     Effort:  Pulmonary effort is normal. No respiratory distress.     Breath sounds: Normal breath sounds.  Abdominal:     Palpations: Abdomen is soft. There is no hepatomegaly or mass.     Tenderness: There is no abdominal tenderness.  Genitourinary:    Comments: No concerns. Musculoskeletal:        General: No tenderness.     Left hand: No bony tenderness. Normal strength.     Cervical back: Normal range of motion.     Comments: No signs of synovitis.  Lymphadenopathy:     Cervical: No cervical adenopathy.     Upper Body:     Right upper body: No supraclavicular adenopathy.  Left upper body: No supraclavicular adenopathy.  Skin:    General: Skin is warm.     Findings: No erythema.  Neurological:     General: No focal deficit present.     Mental Status: He is alert and oriented to person, place, and time.     Cranial Nerves: No cranial nerve deficit.     Sensory: No sensory deficit.     Coordination: Coordination normal.     Gait: Gait normal.     Deep Tendon Reflexes:     Reflex Scores:      Bicep reflexes are 2+ on the right side and 2+ on the left side.      Patellar reflexes are 2+ on the right side and 2+ on the left side. Psychiatric:        Mood and Affect: Mood and affect normal.   ASSESSMENT AND PLAN:  Mr. Connie was seen today for annual exam.  Diagnoses and all orders for this visit: Lab Results  Component Value Date   CHOL 215 (H) 09/02/2019   HDL 43 09/02/2019   LDLCALC 141 (H) 09/02/2019   TRIG 175 (H) 09/02/2019   CHOLHDL 5.0 (H) 09/02/2019   Lab Results  Component Value Date   CREATININE 0.92 09/02/2019   BUN 15 09/02/2019   NA 138 09/02/2019   K 4.5 09/02/2019   CL 104 09/02/2019   CO2 25 09/02/2019   Lab Results  Component Value Date   HGBA1C 5.3 09/02/2019    Routine general medical examination at a health care facility We discussed the importance of regular physical activity and healthy diet for prevention of chronic illness and/or  complications. Preventive guidelines reviewed. Vaccination up to date.  Next CPE in a year.  The 10-year ASCVD risk score Mikey Bussing DC Jr., et al., 2013) is: 5.5%   Values used to calculate the score:     Age: 70 years     Sex: Male     Is Non-Hispanic African American: No     Diabetic: No     Tobacco smoker: Yes     Systolic Blood Pressure: 620 mmHg     Is BP treated: No     HDL Cholesterol: 43 mg/dL     Total Cholesterol: 215 mg/dL  Hyperlipidemia, unspecified hyperlipidemia type -     Lipid panel  Encounter for HCV screening test for low risk patient -     Hepatitis C antibody screen  Screening for endocrine, metabolic and immunity disorder -     Hemoglobin A1c -     Basic metabolic panel  Injury of finger of left hand, initial encounter No bone tenderness, I do not think imaging is needed today. Continue ROM exercises.  Chronic bilateral low back pain without sciatica Try to avoid activities that may aggravate pain. I do not think imaging is needed at this time.   Return in 1 year (on 09/01/2020) for cpe.   Sheree Lalla G. Martin[place], MD  [facility]. [place] [facility].  A few things to remember from today's visit:  Routine general medical examination at a health care facility  Hyperlipidemia, unspecified hyperlipidemia type - Plan: Lipid panel  Encounter for HCV screening test for low risk patient - Plan: Hepatitis C antibody screen  Screening for endocrine, metabolic and immunity disorder - Plan: Basic metabolic panel, Hemoglobin A1c  At least 150 minutes of moderate exercise per week, daily brisk walking for 15-30 min is a good exercise option. Healthy diet low in saturated (animal)  fats and sweets and consisting of fresh fruits and vegetables, lean meats such as fish and white chicken and whole grains.  - Vaccines:  Tdap vaccine every 10 years.  Shingles vaccine recommended at age 65, could be given after 42 years of age but not sure about insurance  coverage.  Pneumonia vaccines: Pneumovax at 60  -Screening recommendations for low/normal risk males:  Screening for diabetes at age 40 and every 3 years. Earlier screening if cardiovascular risk factors.   Lipid screening at 35 and every 3 years. Screening starts in younger males with cardiovascular risk factors.N/A  Colon cancer screening is now at age 58 but your insurance may not cover until age 73 .screening is recommended age 34.  Prostate cancer screening: some controversy, starts usually at 51: Rectal exam and PSA.  Aortic Abdominal Aneurism once between 107 and 55 years old if ever smoker.  Also recommended:  1. Dental visit- Brush and floss your teeth twice daily; visit your dentist twice a year. 2. Eye doctor- Get an eye exam at least every 2 years. 3. Helmet use- Always wear a helmet when riding a bicycle, motorcycle, rollerblading or skateboarding. 4. Safe sex- If you may be exposed to sexually transmitted infections, use a condom. 5. Seat belts- Seat belts can save your live; always wear one. 6. Smoke/Carbon Monoxide detectors- These detectors need to be installed on the appropriate level of your home. Replace batteries at least once a year. 7. Skin cancer- When out in the sun please cover up and use sunscreen 15 SPF or higher. 8. Violence- If anyone is threatening or hurting you, please tell your healthcare provider.  9. Drink alcohol in moderation- Limit alcohol intake to one drink or less per day. Never drink and drive.   Please be sure medication list is accurate. If a new problem present, please set up appointment sooner than planned today.

## 2019-09-02 NOTE — Patient Instructions (Addendum)
A few things to remember from today's visit:  Routine general medical examination at a health care facility  Hyperlipidemia, unspecified hyperlipidemia type - Plan: Lipid panel  Encounter for HCV screening test for low risk patient - Plan: Hepatitis C antibody screen  Screening for endocrine, metabolic and immunity disorder - Plan: Basic metabolic panel, Hemoglobin A1c  At least 150 minutes of moderate exercise per week, daily brisk walking for 15-30 min is a good exercise option. Healthy diet low in saturated (animal) fats and sweets and consisting of fresh fruits and vegetables, lean meats such as fish and white chicken and whole grains.  - Vaccines:  Tdap vaccine every 10 years.  Shingles vaccine recommended at age 25, could be given after 42 years of age but not sure about insurance coverage.  Pneumonia vaccines: Pneumovax at 60  -Screening recommendations for low/normal risk males:  Screening for diabetes at age 28 and every 3 years. Earlier screening if cardiovascular risk factors.   Lipid screening at 35 and every 3 years. Screening starts in younger males with cardiovascular risk factors.N/A  Colon cancer screening is now at age 22 but your insurance may not cover until age 5 .screening is recommended age 55.  Prostate cancer screening: some controversy, starts usually at 9: Rectal exam and PSA.  Aortic Abdominal Aneurism once between 32 and 63 years old if ever smoker.  Also recommended:  1. Dental visit- Brush and floss your teeth twice daily; visit your dentist twice a year. 2. Eye doctor- Get an eye exam at least every 2 years. 3. Helmet use- Always wear a helmet when riding a bicycle, motorcycle, rollerblading or skateboarding. 4. Safe sex- If you may be exposed to sexually transmitted infections, use a condom. 5. Seat belts- Seat belts can save your live; always wear one. 6. Smoke/Carbon Monoxide detectors- These detectors need to be installed on the  appropriate level of your home. Replace batteries at least once a year. 7. Skin cancer- When out in the sun please cover up and use sunscreen 15 SPF or higher. 8. Violence- If anyone is threatening or hurting you, please tell your healthcare provider.  9. Drink alcohol in moderation- Limit alcohol intake to one drink or less per day. Never drink and drive.   Please be sure medication list is accurate. If a new problem present, please set up appointment sooner than planned today.

## 2019-09-03 LAB — LIPID PANEL
Cholesterol: 215 mg/dL — ABNORMAL HIGH (ref <200)
HDL: 43 mg/dL (ref >=40)
LDL Cholesterol (Calc): 141 mg/dL (calc) — ABNORMAL HIGH
Non-HDL Cholesterol (Calc): 172 mg/dL (calc) — ABNORMAL HIGH (ref <130)
Total CHOL/HDL Ratio: 5 (calc) — ABNORMAL HIGH (ref <5.0)
Triglycerides: 175 mg/dL — ABNORMAL HIGH (ref <150)

## 2019-09-03 LAB — HEPATITIS C ANTIBODY
Hepatitis C Ab: NONREACTIVE
SIGNAL TO CUT-OFF: 0.01 (ref <1.00)

## 2019-09-03 LAB — HEMOGLOBIN A1C
Hgb A1c MFr Bld: 5.3 % of total Hgb (ref <5.7)
Mean Plasma Glucose: 105 (calc)
eAG (mmol/L): 5.8 (calc)

## 2019-09-03 LAB — BASIC METABOLIC PANEL
BUN: 15 mg/dL (ref 7–25)
CO2: 25 mmol/L (ref 20–32)
Calcium: 9.3 mg/dL (ref 8.6–10.3)
Chloride: 104 mmol/L (ref 98–110)
Creat: 0.92 mg/dL (ref 0.60–1.35)
Glucose, Bld: 93 mg/dL (ref 65–99)
Potassium: 4.5 mmol/L (ref 3.5–5.3)
Sodium: 138 mmol/L (ref 135–146)

## 2019-09-09 ENCOUNTER — Other Ambulatory Visit: Payer: Self-pay

## 2019-09-09 MED ORDER — LOVASTATIN 20 MG PO TABS: 20.0000 mg | ORAL_TABLET | Freq: Every day | ORAL | 5 refills | Status: DC

## 2019-12-16 ENCOUNTER — Telehealth: Payer: Self-pay | Admitting: Family Medicine

## 2019-12-16 NOTE — Telephone Encounter (Signed)
[facility] Physician Results Form to be filled out--placed in doctor's folder.  Fax to: [phone] upon completion.

## 2019-12-16 NOTE — Telephone Encounter (Signed)
Form filled out & on PCP's desk to be signed.

## 2019-12-17 NOTE — Telephone Encounter (Signed)
Form faxed & a copy sent to scan.

## 2020-03-26 ENCOUNTER — Other Ambulatory Visit: Payer: Self-pay

## 2020-03-26 ENCOUNTER — Encounter (HOSPITAL_COMMUNITY): Payer: Self-pay | Admitting: Emergency Medicine

## 2020-03-26 ENCOUNTER — Ambulatory Visit (HOSPITAL_COMMUNITY)
Admission: EM | Admit: 2020-03-26 | Discharge: 2020-03-26 | Disposition: A | Discharge status: Home / Self Care | Payer: 59

## 2020-03-26 DIAGNOSIS — R0789 Other chest pain: Secondary | ICD-10-CM | POA: Diagnosis not present

## 2020-03-26 MED ORDER — HYDROXYZINE HCL 25 MG PO TABS: 12.5000 mg | ORAL_TABLET | Freq: Three times a day (TID) | ORAL | 0 refills | Status: DC | PRN

## 2020-03-26 NOTE — ED Provider Notes (Signed)
[place]   MRN: [id number] DOB: February 13, 1975  Subjective:   Troy Ashley is a 45 y.o. male presenting for 60-month history of intermittent chest pressure and tightness worse in the past 3 weeks.  Patient states that the symptoms occur randomly at different times and are not associated with anything in particular.  Denies anything else such as fever, runny or stuffy nose, cough, shortness of breath, chest pain, nausea, vomiting, belly pain, diaphoresis.  Patient has been very physically active this year, states that he runs regularly and feels like he is in really good shape.  He smokes cigars a few times a week.  Has about 6 beers throughout the week.  Has a history of hyperlipidemia and is on lovastatin for this. He did stop taking the lovastatin a couple weeks ago just in case.  Denies history of clotting disorders, heart disease.  Patient has thought about the stressors and thinks that anxiety may be playing a role.  His mother has a history of this.  He has a lot of responsibility at home as well with a teenage daughter, a wife that struggling with her work and other extended family members living at home.  He does have a PCP, has not had follow-up with them.  No current facility-administered medications for this encounter.  Current Outpatient Medications:  .  cetirizine (ZYRTEC) 10 MG tablet, Take 10 mg by mouth daily., Disp: , Rfl:  .  Omega-3 Fatty Acids (OMEGA-3 FISH OIL PO), Take by mouth., Disp: , Rfl:  .  lovastatin (MEVACOR) 20 MG tablet, Take 1 tablet (20 mg total) by mouth at bedtime., Disp: 30 tablet, Rfl: 5   Allergies  Allergen Reactions  . Codeine Other (See Comments)    itchy    Past Medical History:  Diagnosis Date  . Anxiety   . Hyperlipidemia      Past Surgical History:  Procedure Laterality Date  . KNEE SURGERY Right 2010    Family History  Problem Relation Age of Onset  . Anxiety disorder Mother   . Hypertension Mother   .  Hyperlipidemia Mother     Social History   Tobacco Use  . Smoking status: Light Tobacco Smoker    Types: Cigars  . Smokeless tobacco: Never Used  . Tobacco comment: occasional cigar  Substance Use Topics  . Alcohol use: No  . Drug use: No    ROS   Objective:   Vitals: BP 118/88   Pulse 68   Temp 98 F (36.7 C) (Oral)   Resp 18   SpO2 100%   Physical Exam Constitutional:      General: He is not in acute distress.    Appearance: Normal appearance. He is well-developed. He is not ill-appearing, toxic-appearing or diaphoretic.  HENT:     Head: Normocephalic and atraumatic.     Right Ear: External ear normal.     Left Ear: External ear normal.     Nose: Nose normal.     Mouth/Throat:     Mouth: Mucous membranes are moist.     Pharynx: Oropharynx is clear.  Eyes:     General: No scleral icterus.    Extraocular Movements: Extraocular movements intact.     Pupils: Pupils are equal, round, and reactive to light.  Cardiovascular:     Rate and Rhythm: Normal rate and regular rhythm.     Heart sounds: Normal heart sounds. No murmur heard. No friction rub. No gallop.   Pulmonary:  Effort: Pulmonary effort is normal. No respiratory distress.     Breath sounds: Normal breath sounds. No stridor. No wheezing, rhonchi or rales.  Neurological:     Mental Status: He is alert and oriented to person, place, and time.  Psychiatric:        Mood and Affect: Mood normal.        Behavior: Behavior normal.        Thought Content: Thought content normal.     ED ECG REPORT   Date: 03/26/2020  Rate: 58bpm  Rhythm: sinus bradycardia  QRS Axis: normal  Intervals: normal  ST/T Wave abnormalities: normal  Conduction Disutrbances:none  Narrative Interpretation: Sinus bradycardia at 58 bpm without any acute findings.  Very comparable to previous EKG. Old EKG Reviewed: unchanged  I have personally reviewed the EKG tracing and agree with the computerized printout as  noted.   Assessment and Plan :   PDMP not reviewed this encounter.  1. Chest discomfort     Offered patient hydroxyzine as an anxiolytic.  Otherwise, he has clear cardiopulmonary exam, reassuring EKG.  He has minimal risk factors and recommended he continue holding off on lovastatin until we can revisit this with his physician. Counseled patient on potential for adverse effects with medications prescribed/recommended today, ER and return-to-clinic precautions discussed, patient verbalized understanding.    Jaynee Eagles, PA-C 03/26/20 1542

## 2020-03-26 NOTE — ED Triage Notes (Signed)
Patient presents to [facility] for evaluation of intermittent chest pressure/tightness x 3 months, once every couple of weeks, and then in the past few weeks it has been weekly.  Patient states it happened Tuesday night of this week, after he had already woken up to go to the bathroom and was laying in bed "thinking about a bunch of stupid stuff for work" and he began to feel pressure in his chest and started to "have to breathe with my belly".  States he started a stain in October of 2021.  Patient states he stopped it this past weekend to make sure that wasn't the issue.

## 2020-04-12 ENCOUNTER — Ambulatory Visit: Payer: 59 | Admitting: Family Medicine

## 2020-07-29 ENCOUNTER — Encounter: Payer: Self-pay | Admitting: Family Medicine

## 2020-08-10 ENCOUNTER — Other Ambulatory Visit: Payer: Self-pay

## 2020-08-10 NOTE — Progress Notes (Signed)
HPI:  Troy Ashley is a 45 y.o.male here today for his routine physical examination.  Last CPE: 09/02/19 He lives with his wife and mother in law.  Regular exercise 3 or more times per week: He is not running due to knee pain. He is biking and doing the elliptical a few times per week.He also plays golf sometimes. Following a healthy diet: Yes, he does not snack frequently. Eats out once per week.   Chronic medical problems: HLD,seasonal allergies,and back pain.  There is no immunization history on file for this patient.  -Hep C screening: 09/02/19 NR  Last colon cancer screening: N/A Last prostate ca screening: N/A  Negative for high alcohol intake . + Tobacco use, cigar some days.  -Concerns and/or follow up today:   Hyperlipidemia: Currently on non pharmacologic treatment. Following a low fat diet: Yes..  Lab Results  Component Value Date   CHOL 215 (H) 09/02/2019   HDL 43 09/02/2019   LDLCALC 141 (H) 09/02/2019   TRIG 175 (H) 09/02/2019   CHOLHDL 5.0 (H) 09/02/2019   "Bad anxiety" since 01/2020. He has had more stress at home, his daughter is having some new behavioral issues and now living with her mother. 05/2020 in [place] he had an episode of panic attack. Chest tightness,could not breath,tachycardia, fear. Felt like he was going to die. Evaluated in the ER for CP on 03/26/20. Not exacerbated by exertion and no associated SOB or diaphoresis. It was thought CP was caused by anxiety state. Intermittent chest tightness since 11/2019. He has not identified exacerbating or alleviating factors.  Concerned about his health, thinking he may have a serious illness like cancer.  Meditation has helped. Trying to start CBT. He has not been on medication before.  Review of Systems  Constitutional:  Negative for activity change, appetite change, fatigue and fever.  HENT:  Negative for mouth sores, nosebleeds, sore throat, trouble swallowing and voice change.    Eyes:  Negative for redness and visual disturbance.  Respiratory:  Negative for cough, shortness of breath and wheezing.   Cardiovascular:  Negative for chest pain, palpitations and leg swelling.  Gastrointestinal:  Negative for abdominal pain, blood in stool, nausea and vomiting.  Endocrine: Negative for cold intolerance, heat intolerance, polydipsia, polyphagia and polyuria.  Genitourinary:  Negative for decreased urine volume, dysuria, genital sores, hematuria and testicular pain.  Musculoskeletal:  Positive for arthralgias and back pain. Negative for joint swelling and myalgias.  Skin:  Negative for color change and rash.  Allergic/Immunologic: Positive for environmental allergies.  Neurological:  Negative for syncope, facial asymmetry, weakness and headaches.  Hematological:  Negative for adenopathy. Does not bruise/bleed easily.  Psychiatric/Behavioral:  Negative for confusion. The patient is nervous/anxious.   All other systems reviewed and are negative.  Current Outpatient Medications on File Prior to Visit  Medication Sig Dispense Refill   cetirizine (ZYRTEC) 10 MG tablet Take 10 mg by mouth daily.     hydrOXYzine (ATARAX/VISTARIL) 25 MG tablet Take 0.5-1 tablets (12.5-25 mg total) by mouth every 8 (eight) hours as needed for anxiety. 30 tablet 0   Omega-3 Fatty Acids (OMEGA-3 FISH OIL PO) Take by mouth.     No current facility-administered medications on file prior to visit.   Past Medical History:  Diagnosis Date   Anxiety    Hyperlipidemia     Past Surgical History:  Procedure Laterality Date   KNEE SURGERY Right 2010    Allergies  Allergen Reactions   Codeine  Other (See Comments)    itchy    Family History  Problem Relation Age of Onset   Anxiety disorder Mother    Hypertension Mother    Hyperlipidemia Mother     Social History   Socioeconomic History   Marital status: Married    Spouse name: Not on file   Number of children: Not on file   Years of  education: Not on file   Highest education level: Not on file  Occupational History   Not on file  Tobacco Use   Smoking status: Light Smoker    Types: Cigars   Smokeless tobacco: Never   Tobacco comments:    occasional cigar  Substance and Sexual Activity   Alcohol use: No   Drug use: No   Sexual activity: Yes  Other Topics Concern   Not on file  Social History Narrative   Not on file   Social Determinants of Health   Financial Resource Strain: Not on file  Food Insecurity: Not on file  Transportation Needs: Not on file  Physical Activity: Not on file  Stress: Not on file  Social Connections: Not on file   Vitals:   08/11/20 0926  BP: 126/80  Pulse: 67  Resp: 16  Temp: 98.2 F (36.8 C)  SpO2: 98%   Body mass index is 26.06 kg/m.  Wt Readings from Last 3 Encounters:  08/11/20 170 lb (77.1 kg)  09/02/19 175 lb 8 oz (79.6 kg)  01/17/17 174 lb (78.9 kg)   Physical Exam Vitals and nursing note reviewed.  Constitutional:      General: He is not in acute distress.    Appearance: He is well-developed.  HENT:     Head: Normocephalic and atraumatic.     Right Ear: Tympanic membrane, ear canal and external ear normal.     Left Ear: Tympanic membrane, ear canal and external ear normal.     Mouth/Throat:     Mouth: Mucous membranes are moist.     Pharynx: Oropharynx is clear.  Eyes:     Extraocular Movements: Extraocular movements intact.     Conjunctiva/sclera: Conjunctivae normal.     Pupils: Pupils are equal, round, and reactive to light.  Neck:     Thyroid: No thyromegaly.     Trachea: No tracheal deviation.  Cardiovascular:     Rate and Rhythm: Normal rate and regular rhythm.     Pulses:          Dorsalis pedis pulses are 2+ on the right side and 2+ on the left side.     Heart sounds: No murmur heard. Pulmonary:     Effort: Pulmonary effort is normal. No respiratory distress.     Breath sounds: Normal breath sounds.  Chest:  Breasts:    Right: No  supraclavicular adenopathy.     Left: No supraclavicular adenopathy.  Abdominal:     Palpations: Abdomen is soft. There is no hepatomegaly or mass.     Tenderness: There is no abdominal tenderness.  Genitourinary:    Comments: No concerns. Musculoskeletal:        General: No tenderness.     Cervical back: Normal range of motion.     Comments: No major deformities appreciated and no signs of synovitis.  Lymphadenopathy:     Cervical: No cervical adenopathy.     Upper Body:     Right upper body: No supraclavicular adenopathy.     Left upper body: No supraclavicular adenopathy.  Skin:  General: Skin is warm.     Findings: No erythema.  Neurological:     Mental Status: He is alert and oriented to person, place, and time.     Cranial Nerves: No cranial nerve deficit.     Sensory: No sensory deficit.     Coordination: Coordination normal.     Gait: Gait normal.     Deep Tendon Reflexes:     Reflex Scores:      Bicep reflexes are 2+ on the right side and 2+ on the left side.      Patellar reflexes are 2+ on the right side and 2+ on the left side.  ASSESSMENT AND PLAN:  [name] was seen today for annual exam and anxiety.  Diagnoses and all orders for this visit: Orders Placed This Encounter  Procedures   Comprehensive metabolic panel   Lipid panel   CBC   TSH   Hemoglobin A1c   Lab Results  Component Value Date   HGBA1C 5.6 08/11/2020   Lab Results  Component Value Date   CHOL 231 (H) 08/11/2020   HDL 52.70 08/11/2020   LDLCALC 156 (H) 08/11/2020   TRIG 112.0 08/11/2020   CHOLHDL 4 08/11/2020   Lab Results  Component Value Date   WBC 4.2 08/11/2020   HGB 14.4 08/11/2020   HCT 43.1 08/11/2020   MCV 88.6 08/11/2020   PLT 212.0 08/11/2020   Lab Results  Component Value Date   TSH 2.04 08/11/2020   Lab Results  Component Value Date   ALT 27 08/11/2020   AST 20 08/11/2020   ALKPHOS 47 08/11/2020   BILITOT 0.6 08/11/2020   Lab Results  Component Value  Date   CREATININE 0.91 08/11/2020   BUN 11 08/11/2020   NA 138 08/11/2020   K 4.3 08/11/2020   CL 103 08/11/2020   CO2 26 08/11/2020   Routine general medical examination at a health care facility We discussed the importance of regular physical activity and healthy diet for prevention of chronic illness and/or complications. Preventive guidelines reviewed. Vaccination up to date. Next CPE in a year.  The 10-year ASCVD risk score Mikey Bussing DC Brooke Bonito., et al., 2013) is: 5.1%   Values used to calculate the score:     Age: 69 years     Sex: Male     Is Non-Hispanic African American: No     Diabetic: No     Tobacco smoker: Yes     Systolic Blood Pressure: 123XX123 mmHg     Is BP treated: No     HDL Cholesterol: 52.7 mg/dL     Total Cholesterol: 231 mg/dL  Hyperlipidemia, unspecified hyperlipidemia type Non pharmacologic treatment recommended for now. Further recommendations will be given according to 10 years CVD risk score and lipid panel numbers.  Chest tightness We discussed possible etiologies. Given his CV risk factors the probability of this being cardia is low but explained never zero. Instructed about warning signs.  Screening for endocrine, metabolic and immunity disorder -     Hemoglobin A1c -     Comprehensive metabolic panel  GAD (generalized anxiety disorder) We discussed treatment options as well as some side effects. He is trying to establish with psychotherapist to start CBT. He agrees with trying low dose sertraline. Instructed about warning signs.  -     sertraline (ZOLOFT) 25 MG tablet; Take 1 tablet (25 mg total) by mouth daily.  Return in 2 months (on 10/11/2020).   Kellar Westberg G. Martinique, MD  [facility]. [place] office.

## 2020-08-11 ENCOUNTER — Ambulatory Visit (INDEPENDENT_AMBULATORY_CARE_PROVIDER_SITE_OTHER): Payer: 59 | Admitting: Family Medicine

## 2020-08-11 ENCOUNTER — Encounter: Payer: Self-pay | Admitting: Family Medicine

## 2020-08-11 VITALS — BP 126/80 | HR 67 | Temp 98.2°F | Resp 16 | Ht 67.72 in | Wt 170.0 lb

## 2020-08-11 DIAGNOSIS — Z13228 Encounter for screening for other metabolic disorders: Secondary | ICD-10-CM

## 2020-08-11 DIAGNOSIS — R0789 Other chest pain: Secondary | ICD-10-CM

## 2020-08-11 DIAGNOSIS — E785 Hyperlipidemia, unspecified: Secondary | ICD-10-CM

## 2020-08-11 DIAGNOSIS — Z130 Encounter for screening for diseases of the blood and blood-forming organs and certain disorders involving the immune mechanism: Secondary | ICD-10-CM

## 2020-08-11 DIAGNOSIS — Z0001 Encounter for general adult medical examination with abnormal findings: Secondary | ICD-10-CM | POA: Diagnosis not present

## 2020-08-11 DIAGNOSIS — Z1329 Encounter for screening for other suspected endocrine disorder: Secondary | ICD-10-CM

## 2020-08-11 DIAGNOSIS — F411 Generalized anxiety disorder: Secondary | ICD-10-CM

## 2020-08-11 DIAGNOSIS — Z0000 Encounter for general adult medical examination without abnormal findings: Secondary | ICD-10-CM

## 2020-08-11 LAB — COMPREHENSIVE METABOLIC PANEL
ALT: 27 U/L (ref 0–53)
AST: 20 U/L (ref 0–37)
Albumin: 4.6 g/dL (ref 3.5–5.2)
Alkaline Phosphatase: 47 U/L (ref 39–117)
BUN: 11 mg/dL (ref 6–23)
CO2: 26 mEq/L (ref 19–32)
Calcium: 9.7 mg/dL (ref 8.4–10.5)
Chloride: 103 mEq/L (ref 96–112)
Creatinine, Ser: 0.91 mg/dL (ref 0.40–1.50)
GFR: 103.7 mL/min (ref >60.00)
Glucose, Bld: 92 mg/dL (ref 70–99)
Potassium: 4.3 mEq/L (ref 3.5–5.1)
Sodium: 138 mEq/L (ref 135–145)
Total Bilirubin: 0.6 mg/dL (ref 0.2–1.2)
Total Protein: 7.2 g/dL (ref 6.0–8.3)

## 2020-08-11 LAB — LIPID PANEL
Cholesterol: 231 mg/dL — ABNORMAL HIGH (ref 0–200)
HDL: 52.7 mg/dL (ref >39.00)
LDL Cholesterol: 156 mg/dL — ABNORMAL HIGH (ref 0–99)
NonHDL: 178.16
Total CHOL/HDL Ratio: 4
Triglycerides: 112 mg/dL (ref 0.0–149.0)
VLDL: 22.4 mg/dL (ref 0.0–40.0)

## 2020-08-11 LAB — TSH: TSH: 2.04 u[IU]/mL (ref 0.35–5.50)

## 2020-08-11 LAB — CBC
HCT: 43.1 % (ref 39.0–52.0)
Hemoglobin: 14.4 g/dL (ref 13.0–17.0)
MCHC: 33.5 g/dL (ref 30.0–36.0)
MCV: 88.6 fl (ref 78.0–100.0)
Platelets: 212 10*3/uL (ref 150.0–400.0)
RBC: 4.86 Mil/uL (ref 4.22–5.81)
RDW: 13.3 % (ref 11.5–15.5)
WBC: 4.2 10*3/uL (ref 4.0–10.5)

## 2020-08-11 LAB — HEMOGLOBIN A1C: Hgb A1c MFr Bld: 5.6 % (ref 4.6–6.5)

## 2020-08-11 MED ORDER — SERTRALINE HCL 25 MG PO TABS: 25.0000 mg | ORAL_TABLET | Freq: Every day | ORAL | 1 refills | Status: DC

## 2020-08-11 NOTE — Patient Instructions (Addendum)
A few things to remember from today's visit:  Routine general medical examination at a health care facility  Hyperlipidemia, unspecified hyperlipidemia type - Plan: Lipid panel, TSH  Chest tightness - Plan: CBC, TSH  Screening for endocrine, metabolic and immunity disorder - Plan: Comprehensive metabolic panel, Hemoglobin A1c  GAD (generalized anxiety disorder) - Plan: sertraline (ZOLOFT) 25 MG tablet Today we started Sertraline, this type of medications can increase suicidal risk. This is more prevalent among children,adolecents, and young adults with major depression or other psychiatric disorders. It can also make depression worse. Most common side effects are gastrointestinal, self limited after a few weeks: diarrhea, nausea, constipation  Or diarrhea among some.  In general it is well tolerated. We will follow closely.     If you need refills please call your pharmacy. Do not use My Chart to request refills or for acute issues that need immediate attention.   At least 150 minutes of moderate exercise per week, daily brisk walking for 15-30 min is a good exercise option. Healthy diet low in saturated (animal) fats and sweets and consisting of fresh fruits and vegetables, lean meats such as fish and white chicken and whole grains.  - Vaccines:  Tdap vaccine every 10 years.  Shingles vaccine recommended at age 51, could be given after 43 years of age but not sure about insurance coverage.  Pneumonia vaccines: Pneumovax at 104  -Screening recommendations for low/normal risk males:  Screening for diabetes at age 40 and every 3 years. Earlier screening if cardiovascular risk factors.   Lipid screening at 35 and every 3 years. Screening starts in younger males with cardiovascular risk factors.  Colon cancer screening is now at age 17 but your insurance may not cover until age 60 .screening is recommended age 92.  Prostate cancer screening: some controversy, starts usually at  37: Rectal exam and PSA.  Aortic Abdominal Aneurism once between 40 and 22 years old if ever smoker.  Also recommended:  Dental visit- Brush and floss your teeth twice daily; visit your dentist twice a year. Eye doctor- Get an eye exam at least every 2 years. Helmet use- Always wear a helmet when riding a bicycle, motorcycle, rollerblading or skateboarding. Safe sex- If you may be exposed to sexually transmitted infections, use a condom. Seat belts- Seat belts can save your live; always wear one. Smoke/Carbon Monoxide detectors- These detectors need to be installed on the appropriate level of your home. Replace batteries at least once a year. Skin cancer- When out in the sun please cover up and use sunscreen 15 SPF or higher. Violence- If anyone is threatening or hurting you, please tell your healthcare provider.  Drink alcohol in moderation- Limit alcohol intake to one drink or less per day. Never drink and drive.  Please be sure medication list is accurate. If a new problem present, please set up appointment sooner than planned today.

## 2020-08-14 ENCOUNTER — Encounter: Payer: Self-pay | Admitting: Family Medicine

## 2020-09-03 ENCOUNTER — Encounter: Payer: 59 | Admitting: Family Medicine

## 2020-09-17 ENCOUNTER — Encounter: Payer: 59 | Admitting: Family Medicine

## 2020-10-11 ENCOUNTER — Other Ambulatory Visit: Payer: Self-pay | Admitting: Family Medicine

## 2020-10-11 DIAGNOSIS — F411 Generalized anxiety disorder: Secondary | ICD-10-CM

## 2020-10-19 NOTE — Progress Notes (Signed)
HPI: [name] is a 45 y.o. male, who is here today for follow up.   He was last seen on 08/11/20, when he was reporting episodes of anxiety. He was started on Sertraline 25 mg daily. Medication has helped greatly. He is dealing with same stressful situations at home.  He was out of town, forgot his medication. During this time he had some anxiety and chest tightness re-occurred, it happens at rest when he was trying to relax. Negative for CP with exertion. No associated palpitations,SOB,or diaphoresis. EKG in 03/2020 sinus arrhythmia and ? IVCD. EKG 04/2013 Mild bradycardia, early repolarization.  HLD: On non pharmacologic treatment. Lab Results  Component Value Date   CHOL 231 (H) 08/11/2020   HDL 52.70 08/11/2020   LDLCALC 156 (H) 08/11/2020   TRIG 112.0 08/11/2020   CHOLHDL 4 08/11/2020   The 10-year ASCVD risk score (Arnett DK, et al., 2019) is: 5.5%   Values used to calculate the score:     Age: 88 years     Sex: Male     Is Non-Hispanic African American: No     Diabetic: No     Tobacco smoker: Yes     Systolic Blood Pressure: 505 mmHg     Is BP treated: No     HDL Cholesterol: 52.7 mg/dL     Total Cholesterol: 231 mg/dL  Review of Systems  Constitutional:  Negative for activity change, appetite change, chills and fever.  HENT:  Negative for sore throat and trouble swallowing.   Respiratory:  Negative for cough and wheezing.   Cardiovascular:  Negative for leg swelling.  Gastrointestinal:  Negative for abdominal pain, nausea and vomiting.  Neurological:  Negative for syncope, weakness and headaches.  Psychiatric/Behavioral:  Negative for confusion and hallucinations.   Rest of ROS, see pertinent positives sand negatives in HPI  Current Outpatient Medications on File Prior to Visit  Medication Sig Dispense Refill   cetirizine (ZYRTEC) 10 MG tablet Take 10 mg by mouth daily.     hydrOXYzine (ATARAX/VISTARIL) 25 MG tablet Take 0.5-1 tablets  (12.5-25 mg total) by mouth every 8 (eight) hours as needed for anxiety. 30 tablet 0   Omega-3 Fatty Acids (OMEGA-3 FISH OIL PO) Take by mouth.     No current facility-administered medications on file prior to visit.   Past Medical History:  Diagnosis Date   Anxiety    Hyperlipidemia    Allergies  Allergen Reactions   Codeine Other (See Comments)    itchy   Social History   Socioeconomic History   Marital status: Married    Spouse name: Not on file   Number of children: Not on file   Years of education: Not on file   Highest education level: Not on file  Occupational History   Not on file  Tobacco Use   Smoking status: Light Smoker    Types: Cigars   Smokeless tobacco: Never   Tobacco comments:    occasional cigar  Substance and Sexual Activity   Alcohol use: No   Drug use: No   Sexual activity: Yes  Other Topics Concern   Not on file  Social History Narrative   Not on file   Social Determinants of Health   Financial Resource Strain: Not on file  Food Insecurity: Not on file  Transportation Needs: Not on file  Physical Activity: Not on file  Stress: Not on file  Social Connections: Not on file   Vitals:   10/20/20 0758  BP: 126/70  Pulse: 61  Resp: 16  SpO2: 98%   Body mass index is 26.83 kg/m.  Physical Exam Vitals and nursing note reviewed.  Constitutional:      General: He is not in acute distress.    Appearance: He is well-developed.  HENT:     Head: Normocephalic and atraumatic.  Eyes:     Conjunctiva/sclera: Conjunctivae normal.  Cardiovascular:     Rate and Rhythm: Normal rate and regular rhythm.     Heart sounds: No murmur heard. Pulmonary:     Effort: Pulmonary effort is normal. No respiratory distress.     Breath sounds: Normal breath sounds.  Abdominal:     Palpations: Abdomen is soft. There is no hepatomegaly or mass.     Tenderness: There is no abdominal tenderness.  Skin:    General: Skin is warm.     Findings: No erythema  or rash.  Neurological:     Mental Status: He is alert and oriented to person, place, and time.     Cranial Nerves: No cranial nerve deficit.     Gait: Gait normal.  Psychiatric:     Comments: Well groomed, good eye contact.   ASSESSMENT AND PLAN:  Troy Ashley was seen today for follow-up.  Orders Placed This Encounter  Procedures   Flu Vaccine QUAD 24mo+IM (Fluarix, Fluzone & Alfiuria Quad PF)   Hyperlipidemia We discussed current recommendations in regard to pharmacologic treatment and CV benefits of statin medications. He agrees with continuing non pharmacologic treatment, low fat diet. We can check FLP in 08/2021.  GAD (generalized anxiety disorder) Problem has greatly improved with Sertraline 25 mg daily, no changes. Because he has tolerated well and he is taking low dose of Sertraline, I think it is appropriate to follow with his next CPE.  Chest tightness Hx does not suggest cardiac etiology. Problem resolved after starting Sertraline. I do not think further work up is needed. Instructed about warning signs.  Need for influenza vaccination - Flu Vaccine QUAD 10mo+IM (Fluarix, Fluzone & Alfiuria Quad PF)  Return in about 43 weeks (around 08/17/2021) for cpe.   Troy Huwe G. Martinique, MD  [facility]. [place] office.

## 2020-10-20 ENCOUNTER — Other Ambulatory Visit: Payer: Self-pay

## 2020-10-20 ENCOUNTER — Ambulatory Visit: Payer: 59 | Admitting: Family Medicine

## 2020-10-20 ENCOUNTER — Encounter: Payer: Self-pay | Admitting: Family Medicine

## 2020-10-20 VITALS — BP 126/70 | HR 61 | Resp 16 | Ht 67.72 in | Wt 175.0 lb

## 2020-10-20 DIAGNOSIS — F411 Generalized anxiety disorder: Secondary | ICD-10-CM | POA: Diagnosis not present

## 2020-10-20 DIAGNOSIS — Z23 Encounter for immunization: Secondary | ICD-10-CM | POA: Diagnosis not present

## 2020-10-20 DIAGNOSIS — E785 Hyperlipidemia, unspecified: Secondary | ICD-10-CM | POA: Diagnosis not present

## 2020-10-20 DIAGNOSIS — R0789 Other chest pain: Secondary | ICD-10-CM

## 2020-10-20 MED ORDER — SERTRALINE HCL 25 MG PO TABS: ORAL_TABLET | ORAL | 3 refills | Status: DC

## 2020-10-20 NOTE — Assessment & Plan Note (Signed)
Problem has greatly improved with Sertraline 25 mg daily, no changes. Because he has tolerated well and he is taking low dose of Sertraline, I think it is appropriate to follow with his next CPE.

## 2020-10-20 NOTE — Patient Instructions (Signed)
A few things to remember from today's visit:   Hyperlipidemia, unspecified hyperlipidemia type  GAD (generalized anxiety disorder)  Chest tightness  If you need refills please call your pharmacy. Do not use My Chart to request refills or for acute issues that need immediate attention.   Continue sertaline same dose. Low fat diet for cholesterol.  Cholesterol is a white, waxy substance similar to fat that the human body needs to help build cells. The liver makes all the cholesterol that a person's body needs. Having high cholesterol (hypercholesterolemia) increases your risk for heart disease and stroke. Extra or excess cholesterol comes from the food that you eat. High cholesterol can often be prevented with diet and lifestyle changes. If you already have high cholesterol, you can control it with diet, lifestyle changes, and medicines. How can high cholesterol affect me? If you have high cholesterol, fatty deposits (plaques) may build up on the walls of your blood vessels. The blood vessels that carry blood away from your heart are called arteries. Plaques make the arteries narrower and stiffer. This in turn can: Restrict or block blood flow and cause blood clots to form. Increase your risk for heart attack and stroke. What can increase my risk for high cholesterol? This condition is more likely to develop in people who: Eat foods that are high in saturated fat or cholesterol. Saturated fat is mostly found in foods that come from animal sources. Are overweight. Are not getting enough exercise. Use products that contain nicotine or tobacco, such as cigarettes, e-cigarettes, and chewing tobacco. Have a family history of high cholesterol (familial hypercholesterolemia). What actions can I take to prevent this? Nutrition  Eat less saturated fat. Avoid trans fats (partially hydrogenated oils). These are often found in margarine and in some baked goods, fried foods, and snacks bought in  packages. Avoid precooked or cured meat, such as bacon, sausages, or meat loaves. Avoid foods and drinks that have added sugars. Eat more fruits, vegetables, and whole grains. Choose healthy sources of protein, such as fish, poultry, lean cuts of red meat, beans, peas, lentils, and nuts. Choose healthy sources of fat, such as: Nuts. Vegetable oils, especially olive oil. Fish that have healthy fats, such as omega-3 fatty acids. These fish include mackerel or salmon. Lifestyle Lose weight if you are overweight. Maintaining a healthy body mass index (BMI) can help prevent or control high cholesterol. It can also lower your risk for diabetes and high blood pressure. Ask your health care provider to help you with a diet and exercise plan to lose weight safely. Do not use any products that contain nicotine or tobacco. These products include cigarettes, chewing tobacco, and vaping devices, such as e-cigarettes. If you need help quitting, ask your health care provider. Alcohol use Do not drink alcohol if: Your health care provider tells you not to drink. You are pregnant, may be pregnant, or are planning to become pregnant. If you drink alcohol: Limit how much you have to: 0-1 drink a day for women. 0-2 drinks a day for men. Know how much alcohol is in your drink. In the [place] one drink equals one 12 oz bottle of beer (355 mL), one 5 oz glass of wine (148 mL), or one 1 oz glass of hard liquor (44 mL). Activity  Get enough exercise. Do exercises as told by your health care provider. Each week, do at least 150 minutes of exercise that takes a medium level of effort (moderate-intensity exercise). This kind of exercise: Makes your  heart beat faster while allowing you to still be able to talk. Can be done in short sessions several times a day or longer sessions a few times a week. For example, on 5 days each week, you could walk fast or ride your bike 3 times a day for 10 minutes each  time. Medicines Your health care provider may recommend medicines to help lower cholesterol. This may be a medicine to lower the amount of cholesterol that your liver makes. You may need medicine if: Diet and lifestyle changes have not lowered your cholesterol enough. You have high cholesterol and other risk factors for heart disease or stroke. Take over-the-counter and prescription medicines only as told by your health care provider. General information Manage your risk factors for high cholesterol. Talk with your health care provider about all your risk factors and how to lower your risk. Manage other conditions that you have, such as diabetes or high blood pressure (hypertension). Have blood tests to check your cholesterol levels at regular points in time as told by your health care provider. Keep all follow-up visits. This is important. Where to find more information American Heart Association: www.heart.org National Heart, Lung, and Blood Institute: https://wilson-eaton.com/ Summary High cholesterol increases your risk for heart disease and stroke. By keeping your cholesterol level low, you can reduce your risk for these conditions. High cholesterol can often be prevented with diet and lifestyle changes. Work with your health care provider to manage your risk factors, and have your blood tested regularly. This information is not intended to replace advice given to you by your health care provider. Make sure you discuss any questions you have with your health care provider. Document Revised: 03/01/2020 Document Reviewed: 03/01/2020 Elsevier Patient Education  2022 [place].  Please be sure medication list is accurate. If a new problem present, please set up appointment sooner than planned today.

## 2020-10-20 NOTE — Assessment & Plan Note (Signed)
We discussed current recommendations in regard to pharmacologic treatment and CV benefits of statin medications. He agrees with continuing non pharmacologic treatment, low fat diet. We can check FLP in 08/2021.

## 2020-11-12 ENCOUNTER — Telehealth: Payer: Self-pay | Admitting: Family Medicine

## 2020-11-12 NOTE — Telephone Encounter (Signed)
Form filled out & placed on pcp's desk for signature.

## 2020-11-12 NOTE — Telephone Encounter (Signed)
Patient dropped off paperwork that he would like Dr. Martinique to complete based off of his last physical.  Patient would like the paperwork to be faxed to [phone]. Patient would also like a call once paperwork has been faxed over.  Paperwork will be placed in folder.  Please advise.

## 2020-11-15 NOTE — Telephone Encounter (Signed)
I faxed paperwork & received confirmation. I called and spoke with patient. I let him know and advised him to let us know if there are any issues.

## 2020-12-16 ENCOUNTER — Other Ambulatory Visit: Payer: Self-pay | Admitting: Family Medicine

## 2020-12-16 DIAGNOSIS — F411 Generalized anxiety disorder: Secondary | ICD-10-CM

## 2021-02-14 ENCOUNTER — Encounter: Payer: Self-pay | Admitting: Family Medicine

## 2021-02-14 ENCOUNTER — Other Ambulatory Visit: Payer: Self-pay

## 2021-02-14 ENCOUNTER — Telehealth (INDEPENDENT_AMBULATORY_CARE_PROVIDER_SITE_OTHER): Payer: 59 | Admitting: Family Medicine

## 2021-02-14 VITALS — Ht 67.72 in

## 2021-02-14 DIAGNOSIS — E785 Hyperlipidemia, unspecified: Secondary | ICD-10-CM | POA: Diagnosis not present

## 2021-02-14 MED ORDER — ROSUVASTATIN CALCIUM 10 MG PO TABS: 10.0000 mg | ORAL_TABLET | Freq: Every day | ORAL | 2 refills | Status: DC

## 2021-02-14 NOTE — Progress Notes (Signed)
Virtual Visit via Video Note I connected with Troy Ashley on 02/14/21 by a video enabled telemedicine application and verified that I am speaking with the correct person using two identifiers.  Location patient: home Location provider:work office Persons participating in the virtual visit: patient, provider  I discussed the limitations of evaluation and management by telemedicine and the availability of in person appointments. The patient expressed understanding and agreed to proceed.  Chief Complaint  Patient presents with   Medication Management    Wants to discuss going back on a statin. Was taking Lovastatin but stopped, would like to discuss some other options.    HPI: Troy Ashley is a 46 yo male with hx of anxiety and HLD being seen today because he would like to discuss other options for HLD pharmacologic treatment. He tried lovastatin 20 mg in the past, he is not sure about specific side effects but he was feeling "different" while taking medication. He has not been very consistent with following a healthful/low-fat diet.  He is trying to exercise regularly. Negative for exertional CP,dyspnea,palpitations,orthopnea,PND,abdominal pain,N/V,claudication like symptoms,or edema.  Lab Results  Component Value Date   CHOL 231 (H) 08/11/2020   HDL 52.70 08/11/2020   LDLCALC 156 (H) 08/11/2020   TRIG 112.0 08/11/2020   CHOLHDL 4 08/11/2020   The 10-year ASCVD risk score (Arnett DK, et al., 2019) is: 5.5%   Values used to calculate the score:     Age: 46 years     Sex: Male     Is Non-Hispanic African American: No     Diabetic: No     Tobacco smoker: Yes     Systolic Blood Pressure: 315 mmHg     Is BP treated: No     HDL Cholesterol: 52.7 mg/dL     Total Cholesterol: 231 mg/dL  ROS: See pertinent positives and negatives per HPI.  Past Medical History:  Diagnosis Date   Anxiety    Hyperlipidemia    Past Surgical History:  Procedure Laterality Date   KNEE SURGERY  Right 2010   Family History  Problem Relation Age of Onset   Anxiety disorder Mother    Hypertension Mother    Hyperlipidemia Mother    Social History   Socioeconomic History   Marital status: Married    Spouse name: Not on file   Number of children: Not on file   Years of education: Not on file   Highest education level: Not on file  Occupational History   Not on file  Tobacco Use   Smoking status: Light Smoker    Types: Cigars   Smokeless tobacco: Never   Tobacco comments:    occasional cigar  Substance and Sexual Activity   Alcohol use: No   Drug use: No   Sexual activity: Yes  Other Topics Concern   Not on file  Social History Narrative   Not on file   Social Determinants of Health   Financial Resource Strain: Not on file  Food Insecurity: Not on file  Transportation Needs: Not on file  Physical Activity: Not on file  Stress: Not on file  Social Connections: Not on file  Intimate Partner Violence: Not on file   Current Outpatient Medications:    cetirizine (ZYRTEC) 10 MG tablet, Take 10 mg by mouth daily., Disp: , Rfl:    hydrOXYzine (ATARAX/VISTARIL) 25 MG tablet, Take 0.5-1 tablets (12.5-25 mg total) by mouth every 8 (eight) hours as needed for anxiety., Disp: 30 tablet, Rfl: 0  Omega-3 Fatty Acids (OMEGA-3 FISH OIL PO), Take by mouth., Disp: , Rfl:    sertraline (ZOLOFT) 25 MG tablet, TAKE 1 TABLET(25 MG) BY MOUTH DAILY, Disp: 90 tablet, Rfl: 3  EXAM:  VITALS per patient if applicable:Ht 5' 1.01" (1.72 m)    BMI 26.83 kg/m   GENERAL: alert, oriented, appears well and in no acute distress  HEENT: atraumatic, conjunctiva clear, no obvious abnormalities on inspection of external nose and ears  NECK: normal movements of the head and neck  LUNGS: on inspection no signs of respiratory distress, breathing rate appears normal, no obvious gross SOB, gasping or wheezing  CV: no obvious cyanosis  MS: moves all visible extremities without noticeable  abnormality  PSYCH/NEURO: pleasant and cooperative, no obvious depression or anxiety, speech and thought processing grossly intact  ASSESSMENT AND PLAN:  Discussed the following assessment and plan: Orders Placed This Encounter  Procedures   ALT   Lipid panel   Hyperlipidemia, unspecified hyperlipidemia type - Plan: rosuvastatin (CRESTOR) 10 MG tablet, ALT, Lipid panel Currently on non pharmacologic treatment. Last FLP was worse than the piror one. We discussed current recommendations in regard to guidelines and treatment options as well as statin CV benefits and side effects. Low fat diet is still recommended. Smoking cessation is also important for CVD prevention. He agrees with trying Rosuvastatin 10 mg daily. Will plan on repeating FLP in 2-3 months.  I discussed the assessment and treatment plan with the patient. The patient was provided an opportunity to ask questions and all were answered. The patient agreed with the plan and demonstrated an understanding of the instructions.  Return for Keep next appt in 08/2021. Fasting labs in 2-3 months.. Johnesha Acheampong G. Martinique, MD  [facility]. [place] office.

## 2021-05-13 ENCOUNTER — Other Ambulatory Visit (INDEPENDENT_AMBULATORY_CARE_PROVIDER_SITE_OTHER): Payer: 59

## 2021-05-13 DIAGNOSIS — E785 Hyperlipidemia, unspecified: Secondary | ICD-10-CM | POA: Diagnosis not present

## 2021-05-13 LAB — LIPID PANEL
Cholesterol: 143 mg/dL (ref 0–200)
HDL: 47.6 mg/dL (ref >39.00)
LDL Cholesterol: 69 mg/dL (ref 0–99)
NonHDL: 95.44
Total CHOL/HDL Ratio: 3
Triglycerides: 132 mg/dL (ref 0.0–149.0)
VLDL: 26.4 mg/dL (ref 0.0–40.0)

## 2021-05-13 LAB — ALT: ALT: 30 U/L (ref 0–53)

## 2021-08-15 NOTE — Progress Notes (Unsigned)
HPI: [name] is a 46 y.o.male here today for his routine physical examination.  Last CPE: 08/11/20  He exercises regularly, he is not longer running due to knee pain but he hikes,bikes,and goes for long walks a few times per week. In general he follows a healthful diet. Home made meals. He drinks 1 beer most days. He has not smoked cigar or use tobacco in a pipe for over a year. Sleeps about 8 hours.  Chronic medical problems: Anxiety,HLD,seasonal allergies,and back pain.  Immunization History  Administered Date(s) Administered   Influenza,inj,Quad PF,6+ Mos 10/20/2020   Tdap 08/16/2021   Health Maintenance  Topic Date Due   TETANUS/TDAP  01/09/2021   COVID-19 Vaccine (1) 09/01/2021 (Originally 04/02/1976)   INFLUENZA VACCINE  09/16/2021 (Originally 08/09/2021)   Hepatitis C Screening  Completed   HIV Screening  Completed   HPV VACCINES  Aged Out   HLD: He is on Rosuvastatin 10 mg daily, Tolerating medication well.  Lab Results  Component Value Date   CHOL 143 05/13/2021   HDL 47.60 05/13/2021   LDLCALC 69 05/13/2021   TRIG 132.0 05/13/2021   CHOLHDL 3 05/13/2021   GAD: He is on Sertraline 25 mg daily. He has tolerated medication well and feels like it is still helping.     08/16/2021    8:30 AM 10/20/2020    3:33 PM 08/11/2020    9:27 AM  Depression screen PHQ 2/9  Decreased Interest 0 0 0  Down, Depressed, Hopeless 0 0 0  PHQ - 2 Score 0 0 0  Altered sleeping 1    Tired, decreased energy 0    Change in appetite 0    Feeling bad or failure about yourself  0    Trouble concentrating 0    Moving slowly or fidgety/restless 0    Suicidal thoughts 0    PHQ-9 Score 1    Difficult doing work/chores Not difficult at all     Having left 5th MTP pain, planning on podiatrist evaluation. No hx of trauma.  Review of Systems  Constitutional:  Negative for activity change, appetite change, chills and fever.  HENT:  Negative for mouth sores, nosebleeds,  sore throat, trouble swallowing and voice change.   Eyes:  Negative for redness and visual disturbance.  Respiratory:  Negative for cough, shortness of breath and wheezing.   Cardiovascular:  Negative for chest pain, palpitations and leg swelling.  Gastrointestinal:  Negative for abdominal pain, blood in stool, nausea and vomiting.  Endocrine: Negative for cold intolerance, heat intolerance, polydipsia, polyphagia and polyuria.  Genitourinary:  Negative for decreased urine volume, dysuria, genital sores, hematuria and testicular pain.  Musculoskeletal:  Positive for arthralgias and back pain. Negative for gait problem.  Skin:  Negative for color change and rash.  Allergic/Immunologic: Positive for environmental allergies.  Neurological:  Negative for syncope, weakness and headaches.  Hematological:  Negative for adenopathy. Does not bruise/bleed easily.  Psychiatric/Behavioral:  Negative for confusion and hallucinations. The patient is nervous/anxious.   All other systems reviewed and are negative.  Current Outpatient Medications on File Prior to Visit  Medication Sig Dispense Refill   cetirizine (ZYRTEC) 10 MG tablet Take 10 mg by mouth daily.     rosuvastatin (CRESTOR) 10 MG tablet Take 1 tablet (10 mg total) by mouth daily. 90 tablet 2   sertraline (ZOLOFT) 25 MG tablet TAKE 1 TABLET(25 MG) BY MOUTH DAILY 90 tablet 3   No current facility-administered medications on file prior to  visit.   Past Medical History:  Diagnosis Date   Anxiety    Hyperlipidemia    Past Surgical History:  Procedure Laterality Date   KNEE SURGERY Right 2010   Allergies  Allergen Reactions   Codeine Other (See Comments)    itchy   Family History  Problem Relation Age of Onset   Anxiety disorder Mother    Hypertension Mother    Hyperlipidemia Mother    Social History   Socioeconomic History   Marital status: Married    Spouse name: Not on file   Number of children: Not on file   Years of  education: Not on file   Highest education level: Not on file  Occupational History   Not on file  Tobacco Use   Smoking status: Light Smoker    Types: Cigars   Smokeless tobacco: Never   Tobacco comments:    occasional cigar  Substance and Sexual Activity   Alcohol use: No   Drug use: No   Sexual activity: Yes  Other Topics Concern   Not on file  Social History Narrative   Not on file   Social Determinants of Health   Financial Resource Strain: Not on file  Food Insecurity: Not on file  Transportation Needs: Not on file  Physical Activity: Not on file  Stress: Not on file  Social Connections: Not on file   Vitals:   08/16/21 0801  BP: 110/70  Pulse: 61  Resp: 12  Temp: 98.4 F (36.9 C)  SpO2: 97%  Body mass index is 27.15 kg/m. Wt Readings from Last 3 Encounters:  08/16/21 177 lb 2 oz (80.3 kg)  10/20/20 175 lb (79.4 kg)  08/11/20 170 lb (77.1 kg)   Physical Exam Vitals and nursing note reviewed.  Constitutional:      General: He is not in acute distress.    Appearance: He is well-developed.  HENT:     Head: Normocephalic and atraumatic.     Right Ear: Tympanic membrane, ear canal and external ear normal.     Left Ear: Tympanic membrane, ear canal and external ear normal.     Mouth/Throat:     Mouth: Mucous membranes are moist.     Pharynx: Oropharynx is clear.  Eyes:     Extraocular Movements: Extraocular movements intact.     Conjunctiva/sclera: Conjunctivae normal.     Pupils: Pupils are equal, round, and reactive to light.  Neck:     Thyroid: No thyromegaly.     Trachea: No tracheal deviation.  Cardiovascular:     Rate and Rhythm: Normal rate and regular rhythm.     Pulses:          Dorsalis pedis pulses are 2+ on the right side and 2+ on the left side.     Heart sounds: No murmur heard. Pulmonary:     Effort: Pulmonary effort is normal. No respiratory distress.     Breath sounds: Normal breath sounds.  Abdominal:     Palpations: Abdomen is  soft. There is no hepatomegaly or mass.     Tenderness: There is no abdominal tenderness.  Genitourinary:    Comments: No concerns. Musculoskeletal:        General: No tenderness.     Thoracic back: No tenderness or bony tenderness.     Lumbar back: No tenderness or bony tenderness.     Comments: No signs of synovitis.  Lymphadenopathy:     Cervical: No cervical adenopathy.     Upper Body:  Right upper body: No supraclavicular adenopathy.     Left upper body: No supraclavicular adenopathy.  Skin:    General: Skin is warm.     Findings: No erythema.  Neurological:     General: No focal deficit present.     Mental Status: He is alert and oriented to person, place, and time.     Cranial Nerves: No cranial nerve deficit.     Sensory: No sensory deficit.     Gait: Gait normal.     Deep Tendon Reflexes:     Reflex Scores:      Bicep reflexes are 2+ on the right side and 2+ on the left side.      Patellar reflexes are 2+ on the right side and 2+ on the left side. Psychiatric:        Mood and Affect: Affect normal. Mood is anxious.   ASSESSMENT AND PLAN:  [name] was seen today for annual exam.  Diagnoses and all orders for this visit: Orders Placed This Encounter  Procedures   Tdap vaccine greater than or equal to 7yo IM   Comprehensive metabolic panel   Hemoglobin A1c   Lipid panel   Lab Results  Component Value Date   CHOL 168 08/16/2021   HDL 48.60 08/16/2021   LDLCALC 94 08/16/2021   TRIG 126.0 08/16/2021   CHOLHDL 3 08/16/2021   Lab Results  Component Value Date   CREATININE 0.98 08/16/2021   BUN 16 08/16/2021   NA 141 08/16/2021   K 4.5 08/16/2021   CL 102 08/16/2021   CO2 28 08/16/2021   Lab Results  Component Value Date   ALT 28 08/16/2021   AST 21 08/16/2021   ALKPHOS 44 08/16/2021   BILITOT 0.7 08/16/2021   Lab Results  Component Value Date   HGBA1C 5.9 08/16/2021   Routine general medical examination at a health care facility We  discussed the importance of regular physical activity and healthy diet for prevention of chronic illness and/or complications. Preventive guidelines reviewed. Vaccination updated. Next CPE in a year.  The 10-year ASCVD risk score (Arnett DK, et al., 2019) is: 2.7%   Values used to calculate the score:     Age: 38 years     Sex: Male     Is Non-Hispanic African American: No     Diabetic: No     Tobacco smoker: Yes     Systolic Blood Pressure: 500 mmHg     Is BP treated: No     HDL Cholesterol: 48.6 mg/dL     Total Cholesterol: 168 mg/dL  Screening for endocrine, metabolic and immunity disorder -     Hemoglobin A1c -     Comprehensive metabolic panel  Need for Tdap vaccination -     Tdap vaccine greater than or equal to 7yo IM  Hyperlipidemia Continue Rosuvastatin 10 mg daily and low fat diet. Further recommendations according to FLP result.  GAD (generalized anxiety disorder) Stable. Continue Sertraline 25 mg daily. As far as symptoms are stable, it is appropriate to continue annual follow ups.  Return in 1 year (on 08/17/2022) for CPE and follow up.  Troy Wassenaar G. Martinique, MD  [facility] [place] office.

## 2021-08-16 ENCOUNTER — Ambulatory Visit (INDEPENDENT_AMBULATORY_CARE_PROVIDER_SITE_OTHER): Payer: 59 | Admitting: Family Medicine

## 2021-08-16 ENCOUNTER — Encounter: Payer: Self-pay | Admitting: Family Medicine

## 2021-08-16 VITALS — BP 110/70 | HR 61 | Temp 98.4°F | Resp 12 | Ht 67.72 in | Wt 177.1 lb

## 2021-08-16 DIAGNOSIS — Z23 Encounter for immunization: Secondary | ICD-10-CM

## 2021-08-16 DIAGNOSIS — F411 Generalized anxiety disorder: Secondary | ICD-10-CM | POA: Diagnosis not present

## 2021-08-16 DIAGNOSIS — Z13228 Encounter for screening for other metabolic disorders: Secondary | ICD-10-CM

## 2021-08-16 DIAGNOSIS — Z130 Encounter for screening for diseases of the blood and blood-forming organs and certain disorders involving the immune mechanism: Secondary | ICD-10-CM | POA: Diagnosis not present

## 2021-08-16 DIAGNOSIS — Z1329 Encounter for screening for other suspected endocrine disorder: Secondary | ICD-10-CM | POA: Diagnosis not present

## 2021-08-16 DIAGNOSIS — E785 Hyperlipidemia, unspecified: Secondary | ICD-10-CM

## 2021-08-16 DIAGNOSIS — Z0000 Encounter for general adult medical examination without abnormal findings: Secondary | ICD-10-CM

## 2021-08-16 LAB — COMPREHENSIVE METABOLIC PANEL
ALT: 28 U/L (ref 0–53)
AST: 21 U/L (ref 0–37)
Albumin: 4.5 g/dL (ref 3.5–5.2)
Alkaline Phosphatase: 44 U/L (ref 39–117)
BUN: 16 mg/dL (ref 6–23)
CO2: 28 mEq/L (ref 19–32)
Calcium: 9.3 mg/dL (ref 8.4–10.5)
Chloride: 102 mEq/L (ref 96–112)
Creatinine, Ser: 0.98 mg/dL (ref 0.40–1.50)
GFR: 94.2 mL/min (ref >60.00)
Glucose, Bld: 95 mg/dL (ref 70–99)
Potassium: 4.5 mEq/L (ref 3.5–5.1)
Sodium: 141 mEq/L (ref 135–145)
Total Bilirubin: 0.7 mg/dL (ref 0.2–1.2)
Total Protein: 7 g/dL (ref 6.0–8.3)

## 2021-08-16 LAB — LIPID PANEL
Cholesterol: 168 mg/dL (ref 0–200)
HDL: 48.6 mg/dL (ref >39.00)
LDL Cholesterol: 94 mg/dL (ref 0–99)
NonHDL: 119.27
Total CHOL/HDL Ratio: 3
Triglycerides: 126 mg/dL (ref 0.0–149.0)
VLDL: 25.2 mg/dL (ref 0.0–40.0)

## 2021-08-16 LAB — HEMOGLOBIN A1C: Hgb A1c MFr Bld: 5.9 % (ref 4.6–6.5)

## 2021-08-16 MED ORDER — ROSUVASTATIN CALCIUM 10 MG PO TABS: 10.0000 mg | ORAL_TABLET | Freq: Every day | ORAL | 2 refills | Status: DC

## 2021-08-16 NOTE — Patient Instructions (Addendum)
A few things to remember from today's visit:  Routine general medical examination at a health care facility  Hyperlipidemia, unspecified hyperlipidemia type - Plan: Lipid panel  Screening for endocrine, metabolic and immunity disorder - Plan: Comprehensive metabolic panel, Hemoglobin A1c  If you need refills please call your pharmacy. Do not use My Chart to request refills or for acute issues that need immediate attention.  Please be sure medication list is accurate. If a new problem present, please set up appointment sooner than planned today.  Health Maintenance, Male Adopting a healthy lifestyle and getting preventive care are important in promoting health and wellness. Ask your health care provider about: The right schedule for you to have regular tests and exams. Things you can do on your own to prevent diseases and keep yourself healthy. What should I know about diet, weight, and exercise? Eat a healthy diet  Eat a diet that includes plenty of vegetables, fruits, low-fat dairy products, and lean protein. Do not eat a lot of foods that are high in solid fats, added sugars, or sodium. Maintain a healthy weight Body mass index (BMI) is a measurement that can be used to identify possible weight problems. It estimates body fat based on height and weight. Your health care provider can help determine your BMI and help you achieve or maintain a healthy weight. Get regular exercise Get regular exercise. This is one of the most important things you can do for your health. Most adults should: Exercise for at least 150 minutes each week. The exercise should increase your heart rate and make you sweat (moderate-intensity exercise). Do strengthening exercises at least twice a week. This is in addition to the moderate-intensity exercise. Spend less time sitting. Even light physical activity can be beneficial. Watch cholesterol and blood lipids Have your blood tested for lipids and cholesterol  at 44 years of age, then have this test every 5 years. You may need to have your cholesterol levels checked more often if: Your lipid or cholesterol levels are high. You are older than 44 years of age. You are at high risk for heart disease. What should I know about cancer screening? Many types of cancers can be detected early and may often be prevented. Depending on your health history and family history, you may need to have cancer screening at various ages. This may include screening for: Colorectal cancer. Prostate cancer. Skin cancer. Lung cancer. What should I know about heart disease, diabetes, and high blood pressure? Blood pressure and heart disease High blood pressure causes heart disease and increases the risk of stroke. This is more likely to develop in people who have high blood pressure readings or are overweight. Talk with your health care provider about your target blood pressure readings. Have your blood pressure checked: Every 3-5 years if you are 65-35 years of age. Every year if you are 31 years old or older. If you are between the ages of 14 and 47 and are a current or former smoker, ask your health care provider if you should have a one-time screening for abdominal aortic aneurysm (AAA). Diabetes Have regular diabetes screenings. This checks your fasting blood sugar level. Have the screening done: Once every three years after age 46 if you are at a normal weight and have a low risk for diabetes. More often and at a younger age if you are overweight or have a high risk for diabetes. What should I know about preventing infection? Hepatitis B If you have a higher  risk for hepatitis B, you should be screened for this virus. Talk with your health care provider to find out if you are at risk for hepatitis B infection. Hepatitis C Blood testing is recommended for: Everyone born from [birth year] through 1963. Anyone with known risk factors for hepatitis C. Sexually transmitted  infections (STIs) You should be screened each year for STIs, including gonorrhea and chlamydia, if: You are sexually active and are younger than 44 years of age. You are older than 44 years of age and your health care provider tells you that you are at risk for this type of infection. Your sexual activity has changed since you were last screened, and you are at increased risk for chlamydia or gonorrhea. Ask your health care provider if you are at risk. Ask your health care provider about whether you are at high risk for HIV. Your health care provider may recommend a prescription medicine to help prevent HIV infection. If you choose to take medicine to prevent HIV, you should first get tested for HIV. You should then be tested every 3 months for as long as you are taking the medicine. Follow these instructions at home: Alcohol use Do not drink alcohol if your health care provider tells you not to drink. If you drink alcohol: Limit how much you have to 0-2 drinks a day. Know how much alcohol is in your drink. In the [place] one drink equals one 12 oz bottle of beer (355 mL), one 5 oz glass of wine (148 mL), or one 1 oz glass of hard liquor (44 mL). Lifestyle Do not use any products that contain nicotine or tobacco. These products include cigarettes, chewing tobacco, and vaping devices, such as e-cigarettes. If you need help quitting, ask your health care provider. Do not use street drugs. Do not share needles. Ask your health care provider for help if you need support or information about quitting drugs. General instructions Schedule regular health, dental, and eye exams. Stay current with your vaccines. Tell your health care provider if: You often feel depressed. You have ever been abused or do not feel safe at home. Summary Adopting a healthy lifestyle and getting preventive care are important in promoting health and wellness. Follow your health care provider's instructions about healthy  diet, exercising, and getting tested or screened for diseases. Follow your health care provider's instructions on monitoring your cholesterol and blood pressure. This information is not intended to replace advice given to you by your health care provider. Make sure you discuss any questions you have with your health care provider. Document Revised: 05/17/2020 Document Reviewed: 05/17/2020 Elsevier Patient Education  [place].

## 2021-08-16 NOTE — Assessment & Plan Note (Signed)
Stable. Continue Sertraline 25 mg daily. As far as symptoms are stable, it is appropriate to continue annual follow ups.

## 2021-08-16 NOTE — Assessment & Plan Note (Signed)
Continue Rosuvastatin 10 mg daily and low fat diet. Further recommendations according to FLP result.

## 2021-09-11 ENCOUNTER — Other Ambulatory Visit: Payer: Self-pay | Admitting: Family Medicine

## 2021-09-11 DIAGNOSIS — E785 Hyperlipidemia, unspecified: Secondary | ICD-10-CM

## 2021-12-24 ENCOUNTER — Other Ambulatory Visit: Payer: Self-pay | Admitting: Family Medicine

## 2021-12-24 DIAGNOSIS — F411 Generalized anxiety disorder: Secondary | ICD-10-CM

## 2021-12-29 ENCOUNTER — Ambulatory Visit: Payer: 59 | Admitting: Adult Health

## 2021-12-29 ENCOUNTER — Emergency Department (HOSPITAL_COMMUNITY): Admission: EM | Admit: 2021-12-29 | Discharge: 2021-12-29 | Discharge status: Against Medical Advice | Payer: 59

## 2021-12-29 ENCOUNTER — Encounter: Payer: Self-pay | Admitting: Adult Health

## 2021-12-29 VITALS — BP 120/82 | HR 80 | Temp 98.1°F | Ht 67.75 in | Wt 179.0 lb

## 2021-12-29 DIAGNOSIS — R55 Syncope and collapse: Secondary | ICD-10-CM

## 2021-12-29 NOTE — Progress Notes (Signed)
Subjective:    Patient ID: Troy Ashley, male    DOB: 11/29/1975, 46 y.o.   MRN: [id number]  HPI 46 year old male who  has a past medical history of Anxiety and Hyperlipidemia.  46 year old male who is being evaluated today for an acute issue.  He reports that yesterday evening he was in his shed and was on a ladder about 6 foot in the air.  He was grabbing a piece of wood and noticed a squirrel nest, when the squirrels came towards him he dodged quickly to his left side.  This caused an in his neck.  When he got off the ladder he started to feel faint, his wife was able to help him walk to the house under his own power and once he got to the house he started to experience diaphoresis, blurred vision, turned pale, had numbness and tingling in his hands, and started vomiting.  He does not believe he hit his head and did not lose consciousness.  He did try to go to the emergency room but sat there for 2 hours without being evaluated in the left.  Today he feels a little fatigued and dizzy but overall his symptoms pretty much resolved after he vomited.   Review of Systems See HPI   Past Medical History:  Diagnosis Date   Anxiety    Hyperlipidemia     Social History   Socioeconomic History   Marital status: Married    Spouse name: Not on file   Number of children: Not on file   Years of education: Not on file   Highest education level: Not on file  Occupational History   Not on file  Tobacco Use   Smoking status: Light Smoker    Types: Cigars   Smokeless tobacco: Never   Tobacco comments:    occasional cigar  Substance and Sexual Activity   Alcohol use: No   Drug use: No   Sexual activity: Yes  Other Topics Concern   Not on file  Social History Narrative   Not on file   Social Determinants of Health   Financial Resource Strain: Not on file  Food Insecurity: Not on file  Transportation Needs: Not on file  Physical Activity: Not on file  Stress: Not on  file  Social Connections: Not on file  Intimate Partner Violence: Not on file    Past Surgical History:  Procedure Laterality Date   KNEE SURGERY Right 2010    Family History  Problem Relation Age of Onset   Anxiety disorder Mother    Hypertension Mother    Hyperlipidemia Mother     Allergies  Allergen Reactions   Codeine Other (See Comments)    itchy    Current Outpatient Medications on File Prior to Visit  Medication Sig Dispense Refill   cetirizine (ZYRTEC) 10 MG tablet Take 10 mg by mouth daily.     rosuvastatin (CRESTOR) 10 MG tablet TAKE 1 TABLET(10 MG) BY MOUTH DAILY 90 tablet 2   sertraline (ZOLOFT) 25 MG tablet TAKE 1 TABLET(25 MG) BY MOUTH DAILY 90 tablet 3   No current facility-administered medications on file prior to visit.    BP 120/82   Pulse 80   Temp 98.1 F (36.7 C) (Oral)   Ht 5' 7.75" (1.721 m)   Wt 179 lb (81.2 kg)   SpO2 98%   BMI 27.42 kg/m       Objective:   Physical Exam Vitals and nursing note reviewed.  Constitutional:      Appearance: Normal appearance.  HENT:     Head: Normocephalic and atraumatic.     Right Ear: Tympanic membrane, ear canal and external ear normal. There is no impacted cerumen.     Left Ear: Tympanic membrane, ear canal and external ear normal. There is no impacted cerumen.  Eyes:     Extraocular Movements: Extraocular movements intact.     Right eye: Normal extraocular motion and no nystagmus.     Left eye: Normal extraocular motion and no nystagmus.     Conjunctiva/sclera: Conjunctivae normal.     Pupils: Pupils are equal, round, and reactive to light.  Cardiovascular:     Rate and Rhythm: Normal rate and regular rhythm.     Pulses: Normal pulses.     Heart sounds: Normal heart sounds.  Pulmonary:     Effort: Pulmonary effort is normal.     Breath sounds: Normal breath sounds.  Musculoskeletal:        General: Normal range of motion.     Cervical back: Full passive range of motion without pain and  normal range of motion. No signs of trauma, rigidity or crepitus. No pain with movement, spinous process tenderness or muscular tenderness. Normal range of motion.  Skin:    General: Skin is warm and dry.     Capillary Refill: Capillary refill takes less than 2 seconds.  Neurological:     General: No focal deficit present.     Mental Status: He is alert and oriented to person, place, and time.  Psychiatric:        Mood and Affect: Mood normal.        Behavior: Behavior normal.        Thought Content: Thought content normal.        Judgment: Judgment normal.       Assessment & Plan:  1. Vasovagal episode -Completely normal exam.  No signs of concussion or vertigo.  No red flags seen or serious etiology.  Likely experienced a vasovagal episode due to sudden pain in his neck when he was dodging squirrels.  Advise rest and hydration over the next 24 hours.  Follow-up if symptoms continue  Dorothyann Peng, NP  Time spent with patient today was 32 minutes which consisted of chart review, discussing vasovagual symptoms vs concussion,  work up, treatment answering questions and documentation.

## 2022-07-04 ENCOUNTER — Other Ambulatory Visit: Payer: Self-pay | Admitting: Family Medicine

## 2022-07-04 DIAGNOSIS — E785 Hyperlipidemia, unspecified: Secondary | ICD-10-CM

## 2022-08-11 ENCOUNTER — Other Ambulatory Visit: Payer: Self-pay | Admitting: Family Medicine

## 2022-08-11 DIAGNOSIS — F411 Generalized anxiety disorder: Secondary | ICD-10-CM

## 2022-08-25 NOTE — Progress Notes (Unsigned)
HPI:  [name] is a 47 y.o.male with PMHx significant from HLD, anxiety,and seasonal allergies here today for his routine physical examination.  Last CPE: 08/16/21  He reports no new problems, surgeries, or serious health issues since his last visit.  He exercises two to three times per week, engaging in cardio activities such as using the elliptical machine and some weight training. He reports a balanced diet, consuming vegetables daily and a mix of chicken, fish, and red meat, with less emphasis on red meat. He sleeps an average of eight hours per night. He consumes six to eight beers on weekends, abstaining from alcohol during the week. He is a former smoker, having quit after losing his pipe almost a year ago.  Immunization History  Administered Date(s) Administered   Influenza,inj,Quad PF,6+ Mos 10/20/2020   Tdap 08/16/2021    Health Maintenance  Topic Date Due   INFLUENZA VACCINE  09/05/2022 (Originally 08/10/2022)   COVID-19 Vaccine (1 - 2023-24 season) 09/14/2022 (Originally 09/09/2021)   DTaP/Tdap/Td (2 - Td or Tdap) 08/17/2031   Hepatitis C Screening  Completed   HIV Screening  Completed   HPV VACCINES  Aged Out   HLD: He has been taking rosuvastatin 10 mg daily, which has been well tolerated.  Lab Results  Component Value Date   CHOL 168 08/16/2021   HDL 48.60 08/16/2021   LDLCALC 94 08/16/2021   TRIG 126.0 08/16/2021   CHOLHDL 3 08/16/2021   Anxiety on sertraline  25 mg daily, which he reports has been effective in managing his anxiety attacks and general stressors for the past two to three years. These stressors are not longer present, he wonders if he can try to wean off medication. Negative for depression like symptoms.  He has not been to an eye care provider recently but plans to do so now that he has vision insurance.  Review of Systems  Constitutional:  Negative for activity change, appetite change and fever.  HENT:  Negative for facial  swelling, nosebleeds, sore throat and trouble swallowing.   Eyes:  Negative for redness and visual disturbance.  Respiratory:  Negative for cough, shortness of breath and wheezing.   Cardiovascular:  Negative for chest pain, palpitations and leg swelling.  Gastrointestinal:  Negative for abdominal pain, blood in stool, nausea and vomiting.  Endocrine: Negative for cold intolerance, heat intolerance, polydipsia, polyphagia and polyuria.  Genitourinary:  Negative for decreased urine volume, dysuria, genital sores, hematuria and testicular pain.  Musculoskeletal:  Positive for arthralgias. Negative for back pain, joint swelling and myalgias.  Skin:  Negative for color change and rash.  Allergic/Immunologic: Positive for environmental allergies.  Neurological:  Negative for syncope, weakness, numbness and headaches.  Hematological:  Negative for adenopathy. Does not bruise/bleed easily.  Psychiatric/Behavioral:  Negative for confusion and sleep disturbance.   All other systems reviewed and are negative.  Current Outpatient Medications on File Prior to Visit  Medication Sig Dispense Refill   cetirizine (ZYRTEC) 10 MG tablet Take 10 mg by mouth daily.     rosuvastatin (CRESTOR) 10 MG tablet TAKE 1 TABLET(10 MG) BY MOUTH DAILY 90 tablet 1   sertraline (ZOLOFT) 25 MG tablet TAKE 1 TABLET(25 MG) BY MOUTH DAILY 90 tablet 3   No current facility-administered medications on file prior to visit.   Past Medical History:  Diagnosis Date   Anxiety    Hyperlipidemia     Past Surgical History:  Procedure Laterality Date   KNEE SURGERY Right 2010  Allergies  Allergen Reactions   Codeine Other (See Comments)    itchy   Family History  Problem Relation Age of Onset   Anxiety disorder Mother    Hypertension Mother    Hyperlipidemia Mother    Social History   Socioeconomic History   Marital status: Married    Spouse name: Not on file   Number of children: Not on file   Years of education:  Not on file   Highest education level: Not on file  Occupational History   Not on file  Tobacco Use   Smoking status: Former    Types: Cigars   Smokeless tobacco: Never   Tobacco comments:    occasional cigar.  Substance and Sexual Activity   Alcohol use: No   Drug use: No   Sexual activity: Yes  Other Topics Concern   Not on file  Social History Narrative   Not on file   Social Determinants of Health   Financial Resource Strain: Not on file  Food Insecurity: Not on file  Transportation Needs: Not on file  Physical Activity: Not on file  Stress: Not on file  Social Connections: Not on file   Vitals:   08/29/22 0851  BP: 118/72  Pulse: 60  Resp: 12  Temp: 98.2 F (36.8 C)  SpO2: 98%   Body mass index is 25.95 kg/m.  Wt Readings from Last 3 Encounters:  08/29/22 169 lb 4 oz (76.8 kg)  12/29/21 179 lb (81.2 kg)  08/16/21 177 lb 2 oz (80.3 kg)   Physical Exam Vitals and nursing note reviewed.  Constitutional:      General: He is not in acute distress.    Appearance: He is well-developed.  HENT:     Head: Normocephalic and atraumatic.     Right Ear: Tympanic membrane, ear canal and external ear normal.     Left Ear: Tympanic membrane, ear canal and external ear normal.     Mouth/Throat:     Mouth: Mucous membranes are moist.     Pharynx: Oropharynx is clear.  Eyes:     Extraocular Movements: Extraocular movements intact.     Conjunctiva/sclera: Conjunctivae normal.     Pupils: Pupils are equal, round, and reactive to light.  Neck:     Thyroid: No thyroid mass or thyromegaly.  Cardiovascular:     Rate and Rhythm: Regular rhythm. Bradycardia present.     Pulses:          Dorsalis pedis pulses are 2+ on the right side and 2+ on the left side.     Heart sounds: No murmur heard.    Comments: HR 56/min Pulmonary:     Effort: Pulmonary effort is normal. No respiratory distress.     Breath sounds: Normal breath sounds.  Abdominal:     Palpations: Abdomen is  soft. There is no hepatomegaly or mass.     Tenderness: There is no abdominal tenderness.  Genitourinary:    Comments: No concerns. Musculoskeletal:        General: No tenderness.     Cervical back: Normal range of motion.     Comments: No major deformities appreciated and no signs of synovitis.  Lymphadenopathy:     Cervical: No cervical adenopathy.     Upper Body:     Right upper body: No supraclavicular adenopathy.     Left upper body: No supraclavicular adenopathy.  Skin:    General: Skin is warm.     Findings: No erythema.  Neurological:  General: No focal deficit present.     Mental Status: He is alert and oriented to person, place, and time.     Cranial Nerves: No cranial nerve deficit.     Sensory: No sensory deficit.     Gait: Gait normal.     Deep Tendon Reflexes:     Reflex Scores:      Bicep reflexes are 2+ on the right side and 2+ on the left side.      Patellar reflexes are 2+ on the right side and 2+ on the left side. Psychiatric:        Mood and Affect: Mood and affect normal.    ASSESSMENT AND PLAN:  [name] was seen today for annual exam.  Diagnoses and all orders for this visit: Lab Results  Component Value Date   CHOL 165 08/29/2022   HDL 49.00 08/29/2022   LDLCALC 100 (H) 08/29/2022   TRIG 79.0 08/29/2022   CHOLHDL 3 08/29/2022   Lab Results  Component Value Date   NA 137 08/29/2022   CL 104 08/29/2022   K 4.1 08/29/2022   CO2 27 08/29/2022   BUN 22 08/29/2022   CREATININE 0.89 08/29/2022   GFR 103.94 08/29/2022   CALCIUM 9.1 08/29/2022   ALBUMIN 4.3 08/29/2022   GLUCOSE 90 08/29/2022   Lab Results  Component Value Date   ALT 29 08/29/2022   AST 20 08/29/2022   ALKPHOS 41 08/29/2022   BILITOT 1.1 08/29/2022   Lab Results  Component Value Date   HGBA1C 5.7 08/29/2022   Routine general medical examination at a health care facility Assessment & Plan: We discussed the importance of regular physical activity and healthy diet  for prevention of chronic illness and/or complications. Preventive guidelines reviewed. Vaccination up to date. He will be 47 late 09/2022, so referral for colon cancer screening placed. Next CPE in a year.   Hyperlipidemia, unspecified hyperlipidemia type Assessment & Plan: Problem has been well-controlled. Continue rosuvastatin 10 mg daily and low-fat diet.  Orders: -     Comprehensive metabolic panel; Future -     Lipid panel; Future  Screening for endocrine, metabolic and immunity disorder -     Hemoglobin A1c; Future  GAD (generalized anxiety disorder) Assessment & Plan: Currently on sertraline 25 mg daily. Problem has greatly improved, he would like to try to wean off medication. Instructions given on AVS about how to start weaning off sertraline as tolerated. Instructed about warning signs.   Colon cancer screening -     Ambulatory referral to Gastroenterology   Return in 1 year (on 08/29/2023) for CPE, chronic problems.  Lewis Keats G. [place], MD  [facility]. [place] office.

## 2022-08-29 ENCOUNTER — Encounter: Payer: Self-pay | Admitting: Family Medicine

## 2022-08-29 ENCOUNTER — Ambulatory Visit (INDEPENDENT_AMBULATORY_CARE_PROVIDER_SITE_OTHER): Payer: 59 | Admitting: Family Medicine

## 2022-08-29 VITALS — BP 118/72 | HR 60 | Temp 98.2°F | Resp 12 | Ht 67.72 in | Wt 169.2 lb

## 2022-08-29 DIAGNOSIS — Z0000 Encounter for general adult medical examination without abnormal findings: Secondary | ICD-10-CM

## 2022-08-29 DIAGNOSIS — Z13228 Encounter for screening for other metabolic disorders: Secondary | ICD-10-CM

## 2022-08-29 DIAGNOSIS — Z1329 Encounter for screening for other suspected endocrine disorder: Secondary | ICD-10-CM

## 2022-08-29 DIAGNOSIS — E785 Hyperlipidemia, unspecified: Secondary | ICD-10-CM

## 2022-08-29 DIAGNOSIS — Z130 Encounter for screening for diseases of the blood and blood-forming organs and certain disorders involving the immune mechanism: Secondary | ICD-10-CM

## 2022-08-29 DIAGNOSIS — F411 Generalized anxiety disorder: Secondary | ICD-10-CM

## 2022-08-29 DIAGNOSIS — Z1211 Encounter for screening for malignant neoplasm of colon: Secondary | ICD-10-CM

## 2022-08-29 LAB — LIPID PANEL
Cholesterol: 165 mg/dL (ref 0–200)
HDL: 49 mg/dL (ref >39.00)
LDL Cholesterol: 100 mg/dL — ABNORMAL HIGH (ref 0–99)
NonHDL: 116.15
Total CHOL/HDL Ratio: 3
Triglycerides: 79 mg/dL (ref 0.0–149.0)
VLDL: 15.8 mg/dL (ref 0.0–40.0)

## 2022-08-29 LAB — COMPREHENSIVE METABOLIC PANEL
ALT: 29 U/L (ref 0–53)
AST: 20 U/L (ref 0–37)
Albumin: 4.3 g/dL (ref 3.5–5.2)
Alkaline Phosphatase: 41 U/L (ref 39–117)
BUN: 22 mg/dL (ref 6–23)
CO2: 27 mEq/L (ref 19–32)
Calcium: 9.1 mg/dL (ref 8.4–10.5)
Chloride: 104 mEq/L (ref 96–112)
Creatinine, Ser: 0.89 mg/dL (ref 0.40–1.50)
GFR: 103.94 mL/min (ref >60.00)
Glucose, Bld: 90 mg/dL (ref 70–99)
Potassium: 4.1 mEq/L (ref 3.5–5.1)
Sodium: 137 mEq/L (ref 135–145)
Total Bilirubin: 1.1 mg/dL (ref 0.2–1.2)
Total Protein: 6.8 g/dL (ref 6.0–8.3)

## 2022-08-29 LAB — HEMOGLOBIN A1C: Hgb A1c MFr Bld: 5.7 % (ref 4.6–6.5)

## 2022-08-29 NOTE — Assessment & Plan Note (Addendum)
Currently on sertraline 25 mg daily. Problem has greatly improved, he would like to try to wean off medication. Instructions given on AVS about how to start weaning off sertraline as tolerated. Instructed about warning signs.

## 2022-08-29 NOTE — Assessment & Plan Note (Signed)
Problem has been well-controlled. Continue rosuvastatin 10 mg daily and low-fat diet.

## 2022-08-29 NOTE — Patient Instructions (Addendum)
A few things to remember from today's visit:  Routine general medical examination at a health care facility  Hyperlipidemia, unspecified hyperlipidemia type - Plan: Comprehensive metabolic panel, Lipid panel  Screening for endocrine, metabolic and immunity disorder - Plan: Hemoglobin A1c  GAD (generalized anxiety disorder)  Colon cancer screening - Plan: Ambulatory referral to Gastroenterology  Weaning off sertraline: Alternate between 1 tab and 1/2 tab daily for 3 weks, then 1/2 tan daily for 3 weeks then every other day for 3 weeks then every 3rd day for 3 weeks and stop. No change sin rest.  If you need refills for medications you take chronically, please call your pharmacy. Do not use My Chart to request refills or for acute issues that need immediate attention. If you send a my chart message, it may take a few days to be addressed, specially if I am not in the office.  Please be sure medication list is accurate. If a new problem present, please set up appointment sooner than planned today.  Health Maintenance, Male Adopting a healthy lifestyle and getting preventive care are important in promoting health and wellness. Ask your health care provider about: The right schedule for you to have regular tests and exams. Things you can do on your own to prevent diseases and keep yourself healthy. What should I know about diet, weight, and exercise? Eat a healthy diet  Eat a diet that includes plenty of vegetables, fruits, low-fat dairy products, and lean protein. Do not eat a lot of foods that are high in solid fats, added sugars, or sodium. Maintain a healthy weight Body mass index (BMI) is a measurement that can be used to identify possible weight problems. It estimates body fat based on height and weight. Your health care provider can help determine your BMI and help you achieve or maintain a healthy weight. Get regular exercise Get regular exercise. This is one of the most  important things you can do for your health. Most adults should: Exercise for at least 150 minutes each week. The exercise should increase your heart rate and make you sweat (moderate-intensity exercise). Do strengthening exercises at least twice a week. This is in addition to the moderate-intensity exercise. Spend less time sitting. Even light physical activity can be beneficial. Watch cholesterol and blood lipids Have your blood tested for lipids and cholesterol at 45 years of age, then have this test every 5 years. You may need to have your cholesterol levels checked more often if: Your lipid or cholesterol levels are high. You are older than 45 years of age. You are at high risk for heart disease. What should I know about cancer screening? Many types of cancers can be detected early and may often be prevented. Depending on your health history and family history, you may need to have cancer screening at various ages. This may include screening for: Colorectal cancer. Prostate cancer. Skin cancer. Lung cancer. What should I know about heart disease, diabetes, and high blood pressure? Blood pressure and heart disease High blood pressure causes heart disease and increases the risk of stroke. This is more likely to develop in people who have high blood pressure readings or are overweight. Talk with your health care provider about your target blood pressure readings. Have your blood pressure checked: Every 3-5 years if you are 24-76 years of age. Every year if you are 85 years old or older. If you are between the ages of 18 and 64 and are a current or former smoker,  ask your health care provider if you should have a one-time screening for abdominal aortic aneurysm (AAA). Diabetes Have regular diabetes screenings. This checks your fasting blood sugar level. Have the screening done: Once every three years after age 67 if you are at a normal weight and have a low risk for diabetes. More often  and at a younger age if you are overweight or have a high risk for diabetes. What should I know about preventing infection? Hepatitis B If you have a higher risk for hepatitis B, you should be screened for this virus. Talk with your health care provider to find out if you are at risk for hepatitis B infection. Hepatitis C Blood testing is recommended for: Everyone born from [birth year] through 1963. Anyone with known risk factors for hepatitis C. Sexually transmitted infections (STIs) You should be screened each year for STIs, including gonorrhea and chlamydia, if: You are sexually active and are younger than 45 years of age. You are older than 45 years of age and your health care provider tells you that you are at risk for this type of infection. Your sexual activity has changed since you were last screened, and you are at increased risk for chlamydia or gonorrhea. Ask your health care provider if you are at risk. Ask your health care provider about whether you are at high risk for HIV. Your health care provider may recommend a prescription medicine to help prevent HIV infection. If you choose to take medicine to prevent HIV, you should first get tested for HIV. You should then be tested every 3 months for as long as you are taking the medicine. Follow these instructions at home: Alcohol use Do not drink alcohol if your health care provider tells you not to drink. If you drink alcohol: Limit how much you have to 0-2 drinks a day. Know how much alcohol is in your drink. In the [place] one drink equals one 12 oz bottle of beer (355 mL), one 5 oz glass of wine (148 mL), or one 1 oz glass of hard liquor (44 mL). Lifestyle Do not use any products that contain nicotine or tobacco. These products include cigarettes, chewing tobacco, and vaping devices, such as e-cigarettes. If you need help quitting, ask your health care provider. Do not use street drugs. Do not share needles. Ask your health care provider  for help if you need support or information about quitting drugs. General instructions Schedule regular health, dental, and eye exams. Stay current with your vaccines. Tell your health care provider if: You often feel depressed. You have ever been abused or do not feel safe at home. Summary Adopting a healthy lifestyle and getting preventive care are important in promoting health and wellness. Follow your health care provider's instructions about healthy diet, exercising, and getting tested or screened for diseases. Follow your health care provider's instructions on monitoring your cholesterol and blood pressure. This information is not intended to replace advice given to you by your health care provider. Make sure you discuss any questions you have with your health care provider. Document Revised: 05/17/2020 Document Reviewed: 05/17/2020 Elsevier Patient Education  2024 [employer].

## 2022-08-31 MED ORDER — SERTRALINE HCL 25 MG PO TABS: ORAL_TABLET | ORAL | Status: DC

## 2022-08-31 NOTE — Assessment & Plan Note (Signed)
We discussed the importance of regular physical activity and healthy diet for prevention of chronic illness and/or complications. Preventive guidelines reviewed. Vaccination up to date. He will be 47 late 09/2022, so referral for colon cancer screening placed. Next CPE in a year.

## 2022-09-01 ENCOUNTER — Encounter: Payer: Self-pay | Admitting: Gastroenterology

## 2022-11-07 ENCOUNTER — Encounter: Payer: Self-pay | Admitting: Gastroenterology

## 2022-11-07 ENCOUNTER — Ambulatory Visit (AMBULATORY_SURGERY_CENTER): Payer: 59

## 2022-11-07 VITALS — Ht 67.0 in | Wt 170.0 lb

## 2022-11-07 DIAGNOSIS — Z1211 Encounter for screening for malignant neoplasm of colon: Secondary | ICD-10-CM

## 2022-11-07 MED ORDER — NA SULFATE-K SULFATE-MG SULF 17.5-3.13-1.6 GM/177ML PO SOLN: 1.0000 | Freq: Once | ORAL | 0 refills | Status: AC

## 2022-11-07 NOTE — Progress Notes (Signed)

## 2022-11-17 ENCOUNTER — Ambulatory Visit (AMBULATORY_SURGERY_CENTER): Payer: 59 | Admitting: Gastroenterology

## 2022-11-17 ENCOUNTER — Encounter: Payer: Self-pay | Admitting: Gastroenterology

## 2022-11-17 VITALS — BP 99/61 | HR 64 | Temp 97.8°F | Resp 13 | Ht 67.5 in | Wt 170.0 lb

## 2022-11-17 DIAGNOSIS — D124 Benign neoplasm of descending colon: Secondary | ICD-10-CM

## 2022-11-17 DIAGNOSIS — Z1211 Encounter for screening for malignant neoplasm of colon: Secondary | ICD-10-CM | POA: Diagnosis present

## 2022-11-17 DIAGNOSIS — D123 Benign neoplasm of transverse colon: Secondary | ICD-10-CM

## 2022-11-17 MED ORDER — SODIUM CHLORIDE 0.9 % IV SOLN: 500.0000 mL | INTRAVENOUS | Status: DC

## 2022-11-17 NOTE — Progress Notes (Signed)
Called to room to assist during endoscopic procedure.  Patient ID and intended procedure confirmed with present staff. Received instructions for my participation in the procedure from the performing physician.  

## 2022-11-17 NOTE — Progress Notes (Signed)
[facility] History and Physical   Primary Care Physician:  [place], Betty G, MD   Reason for Procedure:   Colon cancer screening  Plan:    Screeening colonoscopy     HPI: Troy Ashley is a 47 y.o. male undergoing initial average risk screening colonoscopy.  He has no family history of colon cancer and no chronic GI symptoms.    Past Medical History:  Diagnosis Date   Anxiety    Hyperlipidemia     Past Surgical History:  Procedure Laterality Date   KNEE SURGERY Right 2010    Prior to Admission medications   Medication Sig Start Date End Date Taking? Authorizing Provider  cetirizine (ZYRTEC) 10 MG tablet Take 10 mg by mouth daily.   Yes [provider]  rosuvastatin (CRESTOR) 10 MG tablet TAKE 1 TABLET(10 MG) BY MOUTH DAILY 07/04/22  Yes [place], Betty G, MD  sertraline (ZOLOFT) 25 MG tablet TAKE as instructed to wean med off. 08/31/22  Yes [place], Betty G, MD    Current Outpatient Medications  Medication Sig Dispense Refill   cetirizine (ZYRTEC) 10 MG tablet Take 10 mg by mouth daily.     rosuvastatin (CRESTOR) 10 MG tablet TAKE 1 TABLET(10 MG) BY MOUTH DAILY 90 tablet 1   sertraline (ZOLOFT) 25 MG tablet TAKE as instructed to wean med off.     No current facility-administered medications for this visit.    Allergies as of 11/17/2022 - Review Complete 11/17/2022  Allergen Reaction Noted   Codeine Other (See Comments) 04/23/2013    Family History  Problem Relation Age of Onset   Anxiety disorder Mother    Hypertension Mother    Hyperlipidemia Mother    Colon cancer Neg Hx    Rectal cancer Neg Hx    Stomach cancer Neg Hx    Esophageal cancer Neg Hx    Colon polyps Neg Hx     Social History   Socioeconomic History   Marital status: Married    Spouse name: Not on file   Number of children: Not on file   Years of education: Not on file   Highest education level: Not on file  Occupational History   Not on file  Tobacco Use    Smoking status: Some Days    Types: Cigars   Smokeless tobacco: Never   Tobacco comments:    occasional cigar.  Vaping Use   Vaping status: Never Used  Substance and Sexual Activity   Alcohol use: No   Drug use: No   Sexual activity: Yes  Other Topics Concern   Not on file  Social History Narrative   Not on file   Social Determinants of Health   Financial Resource Strain: Not on file  Food Insecurity: Not on file  Transportation Needs: Not on file  Physical Activity: Not on file  Stress: Not on file  Social Connections: Not on file  Intimate Partner Violence: Not on file    Review of Systems:  All other review of systems negative except as mentioned in the HPI.  Physical Exam: Vital signs BP 120/69 (BP Location: Right Arm, Patient Position: Sitting, Cuff Size: Normal)   Pulse (!) 57   Temp 97.8 F (36.6 C) (Temporal)   Ht 5' 7.5" (1.715 m)   Wt 170 lb (77.1 kg)   SpO2 97%   BMI 26.23 kg/m   General:   Alert,  Well-developed, well-nourished, pleasant and cooperative in NAD Airway:  Mallampati 1 Lungs:  Clear throughout  to auscultation.   Heart:  Regular rate and rhythm; no murmurs, clicks, rubs,  or gallops. Abdomen:  Soft, nontender and nondistended. Normal bowel sounds.   Neuro/Psych:  Normal mood and affect. A and O x 3   Troy Deeg E. Tomasa Rand, MD [facility]

## 2022-11-17 NOTE — Op Note (Signed)
[facility] Patient Name: Troy Ashley Procedure Date: 11/17/2022 10:07 AM MRN: [id number] Endoscopist: Lorin Picket E. Tomasa Rand , MD, [id number] Age: 47 Referring MD:  Date of Birth: Jul 20, 1975 Gender: Male Account #: [id number] Procedure:                Colonoscopy Indications:              Screening for colorectal malignant neoplasm, This                            is the patient's first colonoscopy Medicines:                Monitored Anesthesia Care Procedure:                Pre-Anesthesia Assessment:                           - Prior to the procedure, a History and Physical                            was performed, and patient medications and                            allergies were reviewed. The patient's tolerance of                            previous anesthesia was also reviewed. The risks                            and benefits of the procedure and the sedation                            options and risks were discussed with the patient.                            All questions were answered, and informed consent                            was obtained. Prior Anticoagulants: The patient has                            taken no anticoagulant or antiplatelet agents. ASA                            Grade Assessment: II - A patient with mild systemic                            disease. After reviewing the risks and benefits,                            the patient was deemed in satisfactory condition to                            undergo the procedure.  After obtaining informed consent, the colonoscope                            was passed under direct vision. Throughout the                            procedure, the patient's blood pressure, pulse, and                            oxygen saturations were monitored continuously. The                            CF [id number] #[id number] was introduced through the anus                            and advanced to  the the terminal ileum, with                            identification of the appendiceal orifice and IC                            valve. The colonoscopy was performed without                            difficulty. There was some difficulty visualizing                            the descending and sigmoid colon due to excessive                            spasm. Water immersion used to better some segments                            of the sigmoid colon. The patient tolerated the                            procedure well. The quality of the bowel                            preparation was good. The terminal ileum, ileocecal                            valve, appendiceal orifice, and rectum were                            photographed. The bowel preparation used was SUPREP                            via split dose instruction. Scope In: 10:14:20 AM Scope Out: 10:27:07 AM Scope Withdrawal Time: 0 hours 10 minutes 7 seconds  Total Procedure Duration: 0 hours 12 minutes 47 seconds  Findings:                 The perianal and digital rectal examinations  were                            normal. Pertinent negatives include normal                            sphincter tone and no palpable rectal lesions.                           A 3 mm polyp was found in the transverse colon. The                            polyp was sessile. The polyp was removed with a                            cold snare. Resection and retrieval were complete.                            Estimated blood loss was minimal.                           A 4 mm polyp was found in the descending colon. The                            polyp was sessile. The polyp was removed with a                            cold snare. Resection and retrieval were complete.                            Estimated blood loss was minimal.                           The exam was otherwise normal throughout the                            examined colon.                            The terminal ileum appeared normal.                           The retroflexed view of the distal rectum and anal                            verge was normal and showed no anal or rectal                            abnormalities. Complications:            No immediate complications. Estimated Blood Loss:     Estimated blood loss was minimal. Impression:               - One 3 mm polyp in the transverse colon, removed  with a cold snare. Resected and retrieved.                           - One 4 mm polyp in the descending colon, removed                            with a cold snare. Resected and retrieved.                           - The examined portion of the ileum was normal.                           - The distal rectum and anal verge are normal on                            retroflexion view. Recommendation:           - Patient has a contact number available for                            emergencies. The signs and symptoms of potential                            delayed complications were discussed with the                            patient. Return to normal activities tomorrow.                            Written discharge instructions were provided to the                            patient.                           - Resume previous diet.                           - Continue present medications.                           - Await pathology results.                           - Repeat colonoscopy (date not yet determined) for                            surveillance based on pathology results. [name], MD 11/17/2022 10:32:23 AM This report has been signed electronically.

## 2022-11-17 NOTE — Patient Instructions (Addendum)
Resume previous diet.                           - Continue present medications.                           - Await pathology results.                           - Repeat colonoscopy (date not yet determined) for                            surveillance based on pathology results.  Handout on polyps given.     YOU HAD AN ENDOSCOPIC PROCEDURE TODAY AT THE [facility]:   Refer to the procedure report that was given to you for any specific questions about what was found during the examination.  If the procedure report does not answer your questions, please call your gastroenterologist to clarify.  If you requested that your care partner not be given the details of your procedure findings, then the procedure report has been included in a sealed envelope for you to review at your convenience later.  YOU SHOULD EXPECT: Some feelings of bloating in the abdomen. Passage of more gas than usual.  Walking can help get rid of the air that was put into your GI tract during the procedure and reduce the bloating. If you had a lower endoscopy (such as a colonoscopy or flexible sigmoidoscopy) you may notice spotting of blood in your stool or on the toilet paper. If you underwent a bowel prep for your procedure, you may not have a normal bowel movement for a few days.  Please Note:  You might notice some irritation and congestion in your nose or some drainage.  This is from the oxygen used during your procedure.  There is no need for concern and it should clear up in a day or so.  SYMPTOMS TO REPORT IMMEDIATELY:  Following lower endoscopy (colonoscopy or flexible sigmoidoscopy):  Excessive amounts of blood in the stool  Significant tenderness or worsening of abdominal pains  Swelling of the abdomen that is new, acute  Fever of 100F or higher   For urgent or emergent issues, a gastroenterologist can be reached at any hour by calling [phone]. Do not use MyChart messaging for urgent concerns.     DIET:  We do recommend a small meal at first, but then you may proceed to your regular diet.  Drink plenty of fluids but you should avoid alcoholic beverages for 24 hours.  ACTIVITY:  You should plan to take it easy for the rest of today and you should NOT DRIVE or use heavy machinery until tomorrow (because of the sedation medicines used during the test).    FOLLOW UP: Our staff will call the number listed on your records the next business day following your procedure.  We will call around 7:15- 8:00 am to check on you and address any questions or concerns that you may have regarding the information given to you following your procedure. If we do not reach you, we will leave a message.     If any biopsies were taken you will be contacted by phone or by letter within the next 1-3 weeks.  Please call us at [phone] if you have not heard about the  biopsies in 3 weeks.    SIGNATURES/CONFIDENTIALITY: You and/or your care partner have signed paperwork which will be entered into your electronic medical record.  These signatures attest to the fact that that the information above on your After Visit Summary has been reviewed and is understood.  Full responsibility of the confidentiality of this discharge information lies with you and/or your care-partner.

## 2022-11-17 NOTE — Progress Notes (Signed)
Report to PACU, RN, vss, BBS= Clear.  

## 2022-11-17 NOTE — Progress Notes (Signed)
Vitals-Autumn  Pt's states no medical or surgical changes since previsit or office visit.

## 2022-11-20 ENCOUNTER — Telehealth: Payer: Self-pay

## 2022-11-20 NOTE — Telephone Encounter (Signed)
  Follow up Call-     11/17/2022    9:39 AM 11/17/2022    9:32 AM  Call back number  Post procedure Call Back phone  # [phone]   Permission to leave phone message  Yes     Patient questions:  Do you have a fever, pain , or abdominal swelling? No. Pain Score  0 *  Have you tolerated food without any problems? Yes.    Have you been able to return to your normal activities? Yes.    Do you have any questions about your discharge instructions: Diet   No. Medications  No. Follow up visit  No.  Do you have questions or concerns about your Care? No.  Actions: * If pain score is 4 or above: No action needed, pain <4.

## 2022-11-22 LAB — SURGICAL PATHOLOGY

## 2022-11-22 NOTE — Progress Notes (Signed)
Troy Ashley,  The two polyps which I removed during your recent procedure were proven to be completely benign but are considered "pre-cancerous" polyps that MAY have grown into cancer if they had not been removed.  Studies shows that at least 20% of women over age 45 and 30% of men over age 17 have pre-cancerous polyps.  Based on current nationally recognized surveillance guidelines, I recommend that you have a repeat colonoscopy in 7 years.   If you develop any new rectal bleeding, abdominal pain or significant bowel habit changes, please contact me before then.

## 2023-01-15 ENCOUNTER — Other Ambulatory Visit: Payer: Self-pay | Admitting: Family Medicine

## 2023-01-15 DIAGNOSIS — F411 Generalized anxiety disorder: Secondary | ICD-10-CM

## 2023-10-01 ENCOUNTER — Encounter: Payer: Self-pay | Admitting: Family Medicine

## 2023-10-01 ENCOUNTER — Ambulatory Visit: Payer: Self-pay | Admitting: Family Medicine

## 2023-10-01 ENCOUNTER — Ambulatory Visit (INDEPENDENT_AMBULATORY_CARE_PROVIDER_SITE_OTHER): Admitting: Family Medicine

## 2023-10-01 VITALS — BP 108/76 | HR 55 | Temp 98.1°F | Resp 12 | Ht 67.72 in | Wt 172.2 lb

## 2023-10-01 DIAGNOSIS — Z1329 Encounter for screening for other suspected endocrine disorder: Secondary | ICD-10-CM | POA: Diagnosis not present

## 2023-10-01 DIAGNOSIS — Z13228 Encounter for screening for other metabolic disorders: Secondary | ICD-10-CM | POA: Diagnosis not present

## 2023-10-01 DIAGNOSIS — Z0000 Encounter for general adult medical examination without abnormal findings: Secondary | ICD-10-CM | POA: Diagnosis not present

## 2023-10-01 DIAGNOSIS — Z130 Encounter for screening for diseases of the blood and blood-forming organs and certain disorders involving the immune mechanism: Secondary | ICD-10-CM | POA: Diagnosis not present

## 2023-10-01 DIAGNOSIS — E785 Hyperlipidemia, unspecified: Secondary | ICD-10-CM | POA: Diagnosis not present

## 2023-10-01 LAB — COMPREHENSIVE METABOLIC PANEL WITH GFR
ALT: 22 U/L (ref 0–53)
AST: 21 U/L (ref 0–37)
Albumin: 4.5 g/dL (ref 3.5–5.2)
Alkaline Phosphatase: 42 U/L (ref 39–117)
BUN: 17 mg/dL (ref 6–23)
CO2: 29 meq/L (ref 19–32)
Calcium: 9.4 mg/dL (ref 8.4–10.5)
Chloride: 103 meq/L (ref 96–112)
Creatinine, Ser: 0.85 mg/dL (ref 0.40–1.50)
GFR: 104.58 mL/min (ref >60.00)
Glucose, Bld: 99 mg/dL (ref 70–99)
Potassium: 4.4 meq/L (ref 3.5–5.1)
Sodium: 138 meq/L (ref 135–145)
Total Bilirubin: 0.9 mg/dL (ref 0.2–1.2)
Total Protein: 7 g/dL (ref 6.0–8.3)

## 2023-10-01 LAB — LIPID PANEL
Cholesterol: 224 mg/dL — ABNORMAL HIGH (ref 0–200)
HDL: 50.8 mg/dL (ref >39.00)
LDL Cholesterol: 144 mg/dL — ABNORMAL HIGH (ref 0–99)
NonHDL: 173.13
Total CHOL/HDL Ratio: 4
Triglycerides: 146 mg/dL (ref 0.0–149.0)
VLDL: 29.2 mg/dL (ref 0.0–40.0)

## 2023-10-01 LAB — HEMOGLOBIN A1C: Hgb A1c MFr Bld: 5.8 % (ref 4.6–6.5)

## 2023-10-01 NOTE — Assessment & Plan Note (Signed)
 We discussed the importance of regular physical activity and healthy diet for prevention of chronic illness and/or complications. Preventive guidelines reviewed. Vaccination: Doe snot want flu vaccine today.  Next CPE in a year.

## 2023-10-01 NOTE — Progress Notes (Signed)
 Chief Complaint  Patient presents with   Annual Exam   Discussed the use of AI scribe software for clinical note transcription with the patient, who gave verbal consent to proceed.  History of Present Illness Troy Ashley is a 48 year old male with PMHx significant for HLD, allergic rhinitis, and anxiety who presents for an annual physical exam. Last CPE 08/29/22.  He has no significant health issues since his last physical.  He engages in regular physical activity, including cardio and weight training, and has gained a small amount of weight since the last visit.  His diet includes both home-cooked meals and eating out, with a daily intake of vegetables. He sleeps well, averaging eight hours per night from approximately 11:00 PM to 7:00 AM.  He uses reading glasses occasionally, especially for office work, but has not returned to the eye care provider since his initial visit. He acknowledges a trend towards needing reading glasses more frequently.  He tries to avoid fried foods and sugary drinks, and does not consume soda. He drinks alcohol primarily on weekends, averaging four to six beers.  He quit smoking several years ago and previously smoked cigars socially while playing golf.  He regularly visits the dentist twice a year.  Immunization History  Administered Date(s) Administered   Influenza,inj,Quad PF,6+ Mos 10/20/2020   Tdap 08/16/2021    Health Maintenance  Topic Date Due   Hepatitis B Vaccines 19-59 Average Risk (1 of 3 - 19+ 3-dose series) Never done   HPV VACCINES (1 - 3-dose SCDM series) Never done   Influenza Vaccine  08/10/2023   COVID-19 Vaccine (1 - 2024-25 season) Never done   Colonoscopy  11/16/2029   DTaP/Tdap/Td (2 - Td or Tdap) 08/17/2031   Hepatitis C Screening  Completed   HIV Screening  Completed   Meningococcal B Vaccine  Aged Out   Pneumococcal Vaccine  Discontinued   HLD: He stopped Rosuvastatin  over 6 months ago. No side effects  reported.  Component     Latest Ref Rng 08/29/2022  Cholesterol     0 - 200 mg/dL 834   HDL Cholesterol     >39.00 mg/dL 50.99   Triglycerides     0.0 - 149.0 mg/dL 20.9   Total CHOL/HDL Ratio 3   VLDL     0.0 - 40.0 mg/dL 84.1   LDL (calc)     0 - 99 mg/dL 899 (H)   NonHDL 883.84     Anxiety: He discontinued sertraline  around that time and feels fine without it, noting that his anxiety is manageable despite occasional stress from work. No current anxiety issues, but he experiences occasional stress related to work.  Review of Systems  Constitutional:  Negative for activity change, appetite change and fever.  HENT:  Negative for nosebleeds, sore throat and trouble swallowing.   Eyes:  Negative for redness and visual disturbance.  Respiratory:  Negative for cough, shortness of breath and wheezing.   Cardiovascular:  Negative for chest pain, palpitations and leg swelling.  Gastrointestinal:  Negative for abdominal pain, blood in stool, nausea and vomiting.  Endocrine: Negative for cold intolerance, heat intolerance, polydipsia, polyphagia and polyuria.  Genitourinary:  Negative for decreased urine volume, dysuria, genital sores, hematuria and testicular pain.  Musculoskeletal:  Negative for gait problem and myalgias.  Skin:  Negative for color change and rash.  Allergic/Immunologic: Positive for environmental allergies.  Neurological:  Negative for syncope, weakness, numbness and headaches.  Hematological:  Negative for adenopathy. Does  not bruise/bleed easily.  Psychiatric/Behavioral:  Negative for confusion, hallucinations and sleep disturbance.    Current Outpatient Medications on File Prior to Visit  Medication Sig Dispense Refill   cetirizine (ZYRTEC) 10 MG tablet Take 10 mg by mouth daily.     rosuvastatin  (CRESTOR ) 10 MG tablet TAKE 1 TABLET(10 MG) BY MOUTH DAILY 90 tablet 1   No current facility-administered medications on file prior to visit.   Past Medical History:   Diagnosis Date   Anxiety    Hyperlipidemia    Past Surgical History:  Procedure Laterality Date   KNEE SURGERY Right 2010    Allergies  Allergen Reactions   Codeine Other (See Comments)    itchy    Family History  Problem Relation Age of Onset   Anxiety disorder Mother    Hypertension Mother    Hyperlipidemia Mother    Colon cancer Neg Hx    Rectal cancer Neg Hx    Stomach cancer Neg Hx    Esophageal cancer Neg Hx    Colon polyps Neg Hx     Social History   Socioeconomic History   Marital status: Married    Spouse name: Not on file   Number of children: Not on file   Years of education: Not on file   Highest education level: Not on file  Occupational History   Not on file  Tobacco Use   Smoking status: Some Days    Types: Cigars   Smokeless tobacco: Never   Tobacco comments:    occasional cigar.  Vaping Use   Vaping status: Never Used  Substance and Sexual Activity   Alcohol use: No   Drug use: No   Sexual activity: Yes  Other Topics Concern   Not on file  Social History Narrative   Not on file   Social Drivers of Health   Financial Resource Strain: Not on file  Food Insecurity: Not on file  Transportation Needs: Not on file  Physical Activity: Not on file  Stress: Not on file  Social Connections: Not on file   Vitals:   10/01/23 1129  BP: 108/76  Pulse: (!) 55  Resp: 12  Temp: 98.1 F (36.7 C)  SpO2: 96%   Body mass index is 26.4 kg/m.  Wt Readings from Last 3 Encounters:  10/01/23 172 lb 3.2 oz (78.1 kg)  11/17/22 170 lb (77.1 kg)  11/07/22 170 lb (77.1 kg)   Physical Exam Vitals and nursing note reviewed.  Constitutional:      General: He is not in acute distress.    Appearance: He is well-developed.  HENT:     Head: Normocephalic and atraumatic.     Right Ear: Tympanic membrane, ear canal and external ear normal.     Left Ear: Tympanic membrane, ear canal and external ear normal.     Mouth/Throat:     Mouth: Mucous  membranes are moist.     Pharynx: Oropharynx is clear.  Eyes:     Extraocular Movements: Extraocular movements intact.     Conjunctiva/sclera: Conjunctivae normal.     Pupils: Pupils are equal, round, and reactive to light.  Neck:     Thyroid : No thyroid  mass or thyromegaly.  Cardiovascular:     Rate and Rhythm: Regular rhythm. Bradycardia present.     Pulses:          Dorsalis pedis pulses are 2+ on the right side and 2+ on the left side.     Heart sounds: No murmur  heard. Pulmonary:     Effort: Pulmonary effort is normal. No respiratory distress.     Breath sounds: Normal breath sounds.  Abdominal:     Palpations: Abdomen is soft. There is no hepatomegaly or mass.     Tenderness: There is no abdominal tenderness.  Genitourinary:    Comments: No concerns. Musculoskeletal:        General: No tenderness.     Cervical back: Normal range of motion.     Comments: No major deformities appreciated and no signs of synovitis.  Lymphadenopathy:     Cervical: No cervical adenopathy.     Upper Body:     Right upper body: No supraclavicular adenopathy.     Left upper body: No supraclavicular adenopathy.  Skin:    General: Skin is warm.     Findings: No erythema.  Neurological:     General: No focal deficit present.     Mental Status: He is alert and oriented to person, place, and time.     Cranial Nerves: No cranial nerve deficit.     Sensory: No sensory deficit.     Gait: Gait normal.     Deep Tendon Reflexes:     Reflex Scores:      Bicep reflexes are 2+ on the right side and 2+ on the left side.      Patellar reflexes are 2+ on the right side and 2+ on the left side. Psychiatric:        Mood and Affect: Mood and affect normal.   ASSESSMENT AND PLAN:  Miriam was seen today for annual exam.  Diagnoses and all orders for this visit: Orders Placed This Encounter  Procedures   Comprehensive metabolic panel with GFR   Lipid panel   Hemoglobin A1c   Lab Results  Component  Value Date   HGBA1C 5.8 10/01/2023   Lab Results  Component Value Date   NA 138 10/01/2023   CL 103 10/01/2023   K 4.4 10/01/2023   CO2 29 10/01/2023   BUN 17 10/01/2023   CREATININE 0.85 10/01/2023   GFR 104.58 10/01/2023   CALCIUM  9.4 10/01/2023   ALBUMIN 4.5 10/01/2023   GLUCOSE 99 10/01/2023   Lab Results  Component Value Date   ALT 22 10/01/2023   AST 21 10/01/2023   ALKPHOS 42 10/01/2023   BILITOT 0.9 10/01/2023   Lab Results  Component Value Date   CHOL 224 (H) 10/01/2023   HDL 50.80 10/01/2023   LDLCALC 144 (H) 10/01/2023   TRIG 146.0 10/01/2023   CHOLHDL 4 10/01/2023   The 10-year ASCVD risk score (Arnett DK, et al., 2019) is: 1.8%   Values used to calculate the score:     Age: 35 years     Clincally relevant sex: Male     Is Non-Hispanic African American: No     Diabetic: No     Tobacco smoker: No     Systolic Blood Pressure: 108 mmHg     Is BP treated: No     HDL Cholesterol: 50.8 mg/dL     Total Cholesterol: 224 mg/dL  Routine general medical examination at a health care facility Assessment & Plan: We discussed the importance of regular physical activity and healthy diet for prevention of chronic illness and/or complications. Preventive guidelines reviewed. Vaccination: Doe snot want flu vaccine today.  Next CPE in a year.   Screening for endocrine, metabolic and immunity disorder -     Comprehensive metabolic panel with GFR; Future -  Hemoglobin A1c; Future  Hyperlipidemia, unspecified hyperlipidemia type Assessment & Plan: Currently he is not on Rosuvastatin , discontinued over 6 months ago. Continue non pharmacologic treatment. Further recommendations will be given according to FLP results.  Orders: -     Comprehensive metabolic panel with GFR; Future -     Lipid panel; Future   Return in 1 year (on 09/30/2024).  Dennard Vezina G. [place], MD  [facility]. [place] office.

## 2023-10-01 NOTE — Assessment & Plan Note (Signed)
 Currently he is not on Rosuvastatin , discontinued over 6 months ago. Continue non pharmacologic treatment. Further recommendations will be given according to FLP results.

## 2023-10-01 NOTE — Patient Instructions (Addendum)
 A few things to remember from today's visit:  Screening for endocrine, metabolic and immunity disorder - Plan: Comprehensive metabolic panel with GFR, Hemoglobin A1c  Routine general medical examination at a health care facility  Hyperlipidemia, unspecified hyperlipidemia type - Plan: Comprehensive metabolic panel with GFR, Lipid panel  Do not use My Chart to request refills or for acute issues that need immediate attention. If you send a my chart message, it may take a few days to be addressed, specially if I am not in the office.  Please be sure medication list is accurate. If a new problem present, please set up appointment sooner than planned today.  Health Maintenance, Male Adopting a healthy lifestyle and getting preventive care are important in promoting health and wellness. Ask your health care provider about: The right schedule for you to have regular tests and exams. Things you can do on your own to prevent diseases and keep yourself healthy. What should I know about diet, weight, and exercise? Eat a healthy diet  Eat a diet that includes plenty of vegetables, fruits, low-fat dairy products, and lean protein. Do not eat a lot of foods that are high in solid fats, added sugars, or sodium. Maintain a healthy weight Body mass index (BMI) is a measurement that can be used to identify possible weight problems. It estimates body fat based on height and weight. Your health care provider can help determine your BMI and help you achieve or maintain a healthy weight. Get regular exercise Get regular exercise. This is one of the most important things you can do for your health. Most adults should: Exercise for at least 150 minutes each week. The exercise should increase your heart rate and make you sweat (moderate-intensity exercise). Do strengthening exercises at least twice a week. This is in addition to the moderate-intensity exercise. Spend less time sitting. Even light physical  activity can be beneficial. Watch cholesterol and blood lipids Have your blood tested for lipids and cholesterol at 46 years of age, then have this test every 5 years. You may need to have your cholesterol levels checked more often if: Your lipid or cholesterol levels are high. You are older than 46 years of age. You are at high risk for heart disease. What should I know about cancer screening? Many types of cancers can be detected early and may often be prevented. Depending on your health history and family history, you may need to have cancer screening at various ages. This may include screening for: Colorectal cancer. Prostate cancer. Skin cancer. Lung cancer. What should I know about heart disease, diabetes, and high blood pressure? Blood pressure and heart disease High blood pressure causes heart disease and increases the risk of stroke. This is more likely to develop in people who have high blood pressure readings or are overweight. Talk with your health care provider about your target blood pressure readings. Have your blood pressure checked: Every 3-5 years if you are 31-91 years of age. Every year if you are 30 years old or older. If you are between the ages of 40 and 35 and are a current or former smoker, ask your health care provider if you should have a one-time screening for abdominal aortic aneurysm (AAA). Diabetes Have regular diabetes screenings. This checks your fasting blood sugar level. Have the screening done: Once every three years after age 22 if you are at a normal weight and have a low risk for diabetes. More often and at a younger age if you  are overweight or have a high risk for diabetes. What should I know about preventing infection? Hepatitis B If you have a higher risk for hepatitis B, you should be screened for this virus. Talk with your health care provider to find out if you are at risk for hepatitis B infection. Hepatitis C Blood testing is recommended  for: Everyone born from [birth year] through 1963. Anyone with known risk factors for hepatitis C. Sexually transmitted infections (STIs) You should be screened each year for STIs, including gonorrhea and chlamydia, if: You are sexually active and are younger than 46 years of age. You are older than 46 years of age and your health care provider tells you that you are at risk for this type of infection. Your sexual activity has changed since you were last screened, and you are at increased risk for chlamydia or gonorrhea. Ask your health care provider if you are at risk. Ask your health care provider about whether you are at high risk for HIV. Your health care provider may recommend a prescription medicine to help prevent HIV infection. If you choose to take medicine to prevent HIV, you should first get tested for HIV. You should then be tested every 3 months for as long as you are taking the medicine. Follow these instructions at home: Alcohol use Do not drink alcohol if your health care provider tells you not to drink. If you drink alcohol: Limit how much you have to 0-2 drinks a day. Know how much alcohol is in your drink. In the [place] one drink equals one 12 oz bottle of beer (355 mL), one 5 oz glass of wine (148 mL), or one 1 oz glass of hard liquor (44 mL). Lifestyle Do not use any products that contain nicotine or tobacco. These products include cigarettes, chewing tobacco, and vaping devices, such as e-cigarettes. If you need help quitting, ask your health care provider. Do not use street drugs. Do not share needles. Ask your health care provider for help if you need support or information about quitting drugs. General instructions Schedule regular health, dental, and eye exams. Stay current with your vaccines. Tell your health care provider if: You often feel depressed. You have ever been abused or do not feel safe at home. Summary Adopting a healthy lifestyle and getting preventive care  are important in promoting health and wellness. Follow your health care provider's instructions about healthy diet, exercising, and getting tested or screened for diseases. Follow your health care provider's instructions on monitoring your cholesterol and blood pressure. This information is not intended to replace advice given to you by your health care provider. Make sure you discuss any questions you have with your health care provider. Document Revised: 05/17/2020 Document Reviewed: 05/17/2020 [employer] Patient Education  2024 [employer].
